# Patient Record
Sex: Male | Born: 1955 | Race: Black or African American | Hispanic: No | Marital: Single | Smoking: Current every day smoker
Health system: Southern US, Community
[De-identification: ages and names within clinical notes are randomized; demographics above are authoritative.]

## PROBLEM LIST (undated history)

## (undated) DIAGNOSIS — I739 Peripheral vascular disease, unspecified: Secondary | ICD-10-CM

## (undated) DIAGNOSIS — F329 Major depressive disorder, single episode, unspecified: Secondary | ICD-10-CM

## (undated) DIAGNOSIS — I10 Essential (primary) hypertension: Secondary | ICD-10-CM

## (undated) DIAGNOSIS — F32A Depression, unspecified: Secondary | ICD-10-CM

## (undated) DIAGNOSIS — E785 Hyperlipidemia, unspecified: Secondary | ICD-10-CM

## (undated) DIAGNOSIS — M1990 Unspecified osteoarthritis, unspecified site: Secondary | ICD-10-CM

## (undated) DIAGNOSIS — K219 Gastro-esophageal reflux disease without esophagitis: Secondary | ICD-10-CM

## (undated) DIAGNOSIS — M797 Fibromyalgia: Secondary | ICD-10-CM

## (undated) HISTORY — DX: Hyperlipidemia, unspecified: E78.5

## (undated) HISTORY — PX: FEMORAL ARTERY STENT: SHX1583

---

## 2006-02-18 ENCOUNTER — Emergency Department (HOSPITAL_COMMUNITY): Admission: EM | Admit: 2006-02-18 | Discharge: 2006-02-19 | Payer: Self-pay | Admitting: Emergency Medicine

## 2011-11-07 ENCOUNTER — Encounter (HOSPITAL_COMMUNITY): Payer: Self-pay | Admitting: Pharmacy Technician

## 2011-11-21 ENCOUNTER — Other Ambulatory Visit: Payer: Self-pay | Admitting: Cardiovascular Disease

## 2011-11-22 ENCOUNTER — Encounter (HOSPITAL_COMMUNITY): Payer: Self-pay | Admitting: General Practice

## 2011-11-22 ENCOUNTER — Encounter (HOSPITAL_COMMUNITY)
Admission: RE | Disposition: A | Discharge status: Home / Self Care | Payer: Self-pay | Source: Ambulatory Visit | Attending: Cardiovascular Disease

## 2011-11-22 ENCOUNTER — Ambulatory Visit (HOSPITAL_COMMUNITY)
Admission: RE | Admit: 2011-11-22 | Discharge: 2011-11-23 | Disposition: A | Discharge status: Home / Self Care | Payer: Medicare Other | Source: Ambulatory Visit | Attending: Cardiovascular Disease | Admitting: Cardiovascular Disease

## 2011-11-22 DIAGNOSIS — E785 Hyperlipidemia, unspecified: Secondary | ICD-10-CM | POA: Diagnosis present

## 2011-11-22 DIAGNOSIS — I10 Essential (primary) hypertension: Secondary | ICD-10-CM | POA: Diagnosis present

## 2011-11-22 DIAGNOSIS — E119 Type 2 diabetes mellitus without complications: Secondary | ICD-10-CM | POA: Diagnosis present

## 2011-11-22 DIAGNOSIS — I70219 Atherosclerosis of native arteries of extremities with intermittent claudication, unspecified extremity: Secondary | ICD-10-CM | POA: Insufficient documentation

## 2011-11-22 DIAGNOSIS — I739 Peripheral vascular disease, unspecified: Secondary | ICD-10-CM | POA: Diagnosis present

## 2011-11-22 DIAGNOSIS — Z720 Tobacco use: Secondary | ICD-10-CM

## 2011-11-22 HISTORY — PX: LOWER EXTREMITY ANGIOGRAM: SHX5508

## 2011-11-22 HISTORY — DX: Essential (primary) hypertension: I10

## 2011-11-22 HISTORY — DX: Unspecified osteoarthritis, unspecified site: M19.90

## 2011-11-22 HISTORY — DX: Major depressive disorder, single episode, unspecified: F32.9

## 2011-11-22 HISTORY — DX: Peripheral vascular disease, unspecified: I73.9

## 2011-11-22 HISTORY — DX: Fibromyalgia: M79.7

## 2011-11-22 HISTORY — DX: Depression, unspecified: F32.A

## 2011-11-22 LAB — BASIC METABOLIC PANEL
Calcium: 9.7 mg/dL (ref 8.4–10.5)
GFR calc non Af Amer: 65 mL/min — ABNORMAL LOW (ref >90)
Sodium: 139 mEq/L (ref 135–145)

## 2011-11-22 LAB — CBC
MCH: 27.9 pg (ref 26.0–34.0)
MCHC: 33.3 g/dL (ref 30.0–36.0)
Platelets: 206 10*3/uL (ref 150–400)
RBC: 5.17 MIL/uL (ref 4.22–5.81)
RDW: 13.7 % (ref 11.5–15.5)

## 2011-11-22 LAB — GLUCOSE, CAPILLARY
Glucose-Capillary: 82 mg/dL (ref 70–99)
Glucose-Capillary: 60 mg/dL — ABNORMAL LOW (ref 70–99)
Glucose-Capillary: 113 mg/dL — ABNORMAL HIGH (ref 70–99)
Glucose-Capillary: 125 mg/dL — ABNORMAL HIGH (ref 70–99)

## 2011-11-22 LAB — PROTIME-INR: Prothrombin Time: 12.6 seconds (ref 11.6–15.2)

## 2011-11-22 SURGERY — ANGIOGRAM, LOWER EXTREMITY
Anesthesia: LOCAL

## 2011-11-22 MED ORDER — FAMOTIDINE IN NACL 20-0.9 MG/50ML-% IV SOLN: 20.0000 mg | INTRAVENOUS | Status: DC

## 2011-11-22 MED ORDER — SIMVASTATIN 40 MG PO TABS
40.0000 mg | ORAL_TABLET | Freq: Every day | ORAL | Status: DC
  Administered 2011-11-22: 40 mg via ORAL
  Filled 2011-11-22 (×2): qty 1

## 2011-11-22 MED ORDER — ASPIRIN 81 MG PO CHEW
CHEWABLE_TABLET | ORAL | Status: AC
  Filled 2011-11-22: qty 4

## 2011-11-22 MED ORDER — FENTANYL CITRATE 0.05 MG/ML IJ SOLN
INTRAMUSCULAR | Status: AC
  Filled 2011-11-22: qty 2

## 2011-11-22 MED ORDER — LIDOCAINE HCL (PF) 1 % IJ SOLN
INTRAMUSCULAR | Status: AC
  Filled 2011-11-22: qty 30

## 2011-11-22 MED ORDER — ACETAMINOPHEN 325 MG PO TABS
650.0000 mg | ORAL_TABLET | ORAL | Status: DC | PRN
  Administered 2011-11-22 (×2): 650 mg via ORAL
  Filled 2011-11-22 (×2): qty 2

## 2011-11-22 MED ORDER — LISINOPRIL-HYDROCHLOROTHIAZIDE 20-25 MG PO TABS: 1.0000 | ORAL_TABLET | Freq: Every day | ORAL | Status: DC

## 2011-11-22 MED ORDER — ALBUTEROL SULFATE HFA 108 (90 BASE) MCG/ACT IN AERS: 2.0000 | INHALATION_SPRAY | Freq: Four times a day (QID) | RESPIRATORY_TRACT | Status: DC | PRN

## 2011-11-22 MED ORDER — DIAZEPAM 5 MG PO TABS
5.0000 mg | ORAL_TABLET | ORAL | Status: AC
  Administered 2011-11-22: 5 mg via ORAL
  Filled 2011-11-22: qty 1

## 2011-11-22 MED ORDER — SODIUM CHLORIDE 0.9 % IV SOLN
INTRAVENOUS | Status: DC
  Administered 2011-11-22: 08:00:00 via INTRAVENOUS

## 2011-11-22 MED ORDER — LISINOPRIL 20 MG PO TABS
20.0000 mg | ORAL_TABLET | Freq: Every day | ORAL | Status: DC
  Administered 2011-11-22 – 2011-11-23 (×2): 20 mg via ORAL
  Filled 2011-11-22 (×2): qty 1

## 2011-11-22 MED ORDER — MIDAZOLAM HCL 2 MG/2ML IJ SOLN
INTRAMUSCULAR | Status: AC
  Filled 2011-11-22: qty 2

## 2011-11-22 MED ORDER — INSULIN GLARGINE 100 UNIT/ML ~~LOC~~ SOLN
65.0000 [IU] | Freq: Every day | SUBCUTANEOUS | Status: DC
  Administered 2011-11-22: 21:00:00 65 [IU] via SUBCUTANEOUS

## 2011-11-22 MED ORDER — DIPHENHYDRAMINE HCL 50 MG/ML IJ SOLN: 25.0000 mg | INTRAMUSCULAR | Status: DC

## 2011-11-22 MED ORDER — SODIUM CHLORIDE 0.9 % IJ SOLN: 3.0000 mL | INTRAMUSCULAR | Status: DC | PRN

## 2011-11-22 MED ORDER — INSULIN ASPART 100 UNIT/ML ~~LOC~~ SOLN
4.0000 [IU] | Freq: Three times a day (TID) | SUBCUTANEOUS | Status: DC
  Administered 2011-11-22 – 2011-11-23 (×3): 4 [IU] via SUBCUTANEOUS

## 2011-11-22 MED ORDER — INSULIN ASPART 100 UNIT/ML ~~LOC~~ SOLN: 4.0000 [IU] | Freq: Three times a day (TID) | SUBCUTANEOUS | Status: DC

## 2011-11-22 MED ORDER — CLOPIDOGREL BISULFATE 75 MG PO TABS
75.0000 mg | ORAL_TABLET | Freq: Every day | ORAL | Status: DC
  Administered 2011-11-23: 75 mg via ORAL
  Filled 2011-11-22: qty 1

## 2011-11-22 MED ORDER — HEPARIN SODIUM (PORCINE) 1000 UNIT/ML IJ SOLN
INTRAMUSCULAR | Status: AC
  Filled 2011-11-22: qty 1

## 2011-11-22 MED ORDER — ASPIRIN EC 325 MG PO TBEC
325.0000 mg | DELAYED_RELEASE_TABLET | Freq: Every day | ORAL | Status: DC
  Administered 2011-11-23: 325 mg via ORAL
  Filled 2011-11-22: qty 1

## 2011-11-22 MED ORDER — SODIUM CHLORIDE 0.9 % IV SOLN
INTRAVENOUS | Status: AC
  Administered 2011-11-22: 14:00:00 via INTRAVENOUS

## 2011-11-22 MED ORDER — METHYLPREDNISOLONE SODIUM SUCC 125 MG IJ SOLR: 60.0000 mg | INTRAMUSCULAR | Status: DC

## 2011-11-22 MED ORDER — ONDANSETRON HCL 4 MG/2ML IJ SOLN: 4.0000 mg | Freq: Four times a day (QID) | INTRAMUSCULAR | Status: DC | PRN

## 2011-11-22 MED ORDER — CLOPIDOGREL BISULFATE 300 MG PO TABS
ORAL_TABLET | ORAL | Status: AC
  Administered 2011-11-23: 09:00:00 75 mg via ORAL
  Filled 2011-11-22: qty 1

## 2011-11-22 MED ORDER — HYDROCHLOROTHIAZIDE 25 MG PO TABS
25.0000 mg | ORAL_TABLET | Freq: Every day | ORAL | Status: DC
  Administered 2011-11-22 – 2011-11-23 (×2): 25 mg via ORAL
  Filled 2011-11-22 (×2): qty 1

## 2011-11-22 MED ORDER — ASPIRIN 81 MG PO TABS: 81.0000 mg | ORAL_TABLET | Freq: Every day | ORAL | Status: DC

## 2011-11-22 NOTE — H&P (Signed)
  H & P will be scanned in.  Pt was reexamined and existing H & P reviewed. No changes found.  Lorretta Harp, MD [facility] 11/22/2011 9:49 AM

## 2011-11-22 NOTE — Op Note (Signed)
Robert Rocha is a 56 y.o. male    SN:[id number] LOCATION:  FACILITY: [facility]  PHYSICIAN: Quay Burow, M.D. Jun 24, 1955   DATE OF PROCEDURE:  11/22/2011  DATE OF DISCHARGE:  [place]  PV Intervention    History obtained from chart review. Mr. 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 He had Dopplers of her office that revealed short segment occlusion of his left SFA. He had a negative Myoview. He presents now for angiography and potential percutaneous intervention.   PROCEDURE DESCRIPTION:    The patient was brought to the second floor  [place] [facility] cath lab in the postabsorptive state. He was  premedicated with Valium 5 mg by mouth, IV Versed and fentanyl.. His ) was prepped and shaved in usual sterile fashion. Xylocaine 1% was used for local anesthesia. A 5 French sheath was inserted into the right common femoral artery using standard Seldinger technique. The patient received 7000 units  of heparin  intravenously.  The ACT was measured at greater than 220. Total of 220 cc of contrast was administered during the case.    HEMODYNAMICS:    AO SYSTOLIC/AO DIASTOLIC: 0000000   ANGIOGRAPHIC RESULTS:   1: Abdominal aorta-renal arteries are widely patent. The infrarenal abdominal aorta and iliac bifurcation were free of significant atherosclerotic changes.  2: Left lower extremity-50% mid left SFA stenosis, short segment occlusion in the adductor canal, three-vessel runoff.  3: Right lower extremity-tandem 50% stenoses in the mid right SFA. The below the knee tibial vessels were adequately visualized.  IMPRESSION:short segment occlusion mid left SFA. We'll proceed with attempt to cross, PTA and stent.   Procedure description: The 5 French sheath was  exchanged for a 7 [place] destination sheath. Contralateral access was obtained with a 5 [place] crossover catheter, angled Glidewire at [employer] wire. I was able to get down to the point of occlusion with an 035 across and told catheter and a stiff angled Glidewire. Was able to cross the lesion, advancing them across over the guidewire and exchanged for a 35 versicolor wire. I then verified with intraluminal projecting through the central catheter. Predilatation was performed with a 4 mm x 4 cm balloon, stenting with a 7 mm x 60 mm Product/process development scientist self expanding stent, and post dilatation was performed with overlapping 6 mm x 4 cm long balloon inflations. Completion angiography was performed revealing reduction with short segment total occlusion to 0% residual with excellent flow vessel runoff.  Final impression: Successful PTA and stenting of short segment of occlusion left SFA with Nitinol self-expanding stent. Patient tolerated the procedure well. The right groin was sealed with the [facility] hemostasis device with excellent result. The patient left the Cath Lab in stable condition. He was given 300 mg of by mouth Plavix prior to leaving the cath lab. He will be hydrated overnight, discharged him in the morning will do followup Dopplers in her office at which he'll see me back.  Lorretta Harp MD, [facility] 11/22/2011 10:53 AM

## 2011-11-22 NOTE — Progress Notes (Signed)
Utilization Review Completed.  

## 2011-11-22 NOTE — Progress Notes (Signed)
Pt denies contrast/shellfish/iodine allergy. Discussed with Jana Half and contrast allergy meds d/c'd.

## 2011-11-23 DIAGNOSIS — I10 Essential (primary) hypertension: Secondary | ICD-10-CM | POA: Diagnosis present

## 2011-11-23 DIAGNOSIS — E785 Hyperlipidemia, unspecified: Secondary | ICD-10-CM | POA: Diagnosis present

## 2011-11-23 DIAGNOSIS — Z720 Tobacco use: Secondary | ICD-10-CM

## 2011-11-23 DIAGNOSIS — I739 Peripheral vascular disease, unspecified: Secondary | ICD-10-CM | POA: Diagnosis present

## 2011-11-23 DIAGNOSIS — E119 Type 2 diabetes mellitus without complications: Secondary | ICD-10-CM | POA: Diagnosis present

## 2011-11-23 LAB — BASIC METABOLIC PANEL
BUN: 13 mg/dL (ref 6–23)
CO2: 30 mEq/L (ref 19–32)
Calcium: 9.8 mg/dL (ref 8.4–10.5)
GFR calc Af Amer: 75 mL/min — ABNORMAL LOW (ref >90)
GFR calc non Af Amer: 65 mL/min — ABNORMAL LOW (ref >90)
Glucose, Bld: 74 mg/dL (ref 70–99)
Sodium: 142 mEq/L (ref 135–145)

## 2011-11-23 LAB — CBC
HCT: 41.4 % (ref 39.0–52.0)
Hemoglobin: 13.6 g/dL (ref 13.0–17.0)
RDW: 13.9 % (ref 11.5–15.5)
WBC: 10.3 10*3/uL (ref 4.0–10.5)

## 2011-11-23 MED ORDER — ASPIRIN 325 MG PO TBEC: 325.0000 mg | DELAYED_RELEASE_TABLET | Freq: Every day | ORAL | Status: DC

## 2011-11-23 MED ORDER — CLOPIDOGREL BISULFATE 75 MG PO TABS: 75.0000 mg | ORAL_TABLET | Freq: Every day | ORAL | Status: DC

## 2011-11-23 MED ORDER — SIMVASTATIN 40 MG PO TABS: 40.0000 mg | ORAL_TABLET | Freq: Every day | ORAL | Status: DC

## 2011-11-23 NOTE — Progress Notes (Signed)
[facility]  Subjective: No Complaints.  Objective: Vital signs in last 24 hours: Temp:  [97.4 F (36.3 C)-98.5 F (36.9 C)] 97.7 F (36.5 C) (10/02 0801) Pulse Rate:  [59-70] 67  (10/02 0801) Resp:  [15-21] 19  (10/02 0801) BP: (112-142)/(67-78) 122/71 mmHg (10/02 0801) SpO2:  [95 %-97 %] 95 % (10/02 0801) Weight:  [102.4 kg (225 lb 12 oz)] 102.4 kg (225 lb 12 oz) (10/02 0020) Last BM Date: 11/22/11  Intake/Output from previous day: 10/01 0701 - 10/02 0700 In: 815 [P.O.:440; I.V.:375] Out: 1600 [Urine:1600] Intake/Output this shift:    Medications Current Facility-Administered Medications  Medication Dose Route Frequency Provider Last Rate Last Dose  . 0.9 %  sodium chloride infusion   Intravenous Continuous Lorretta Harp, MD 75 mL/hr at 11/22/11 1400    . acetaminophen (TYLENOL) tablet 650 mg  650 mg Oral Q4H PRN Lorretta Harp, MD   650 mg at 11/22/11 2115  . albuterol (PROVENTIL HFA;VENTOLIN HFA) 108 (90 BASE) MCG/ACT inhaler 2 puff  2 puff Inhalation Q6H PRN Lorretta Harp, MD      . aspirin 81 MG chewable tablet           . aspirin EC tablet 325 mg  325 mg Oral Daily Lorretta Harp, MD   325 mg at 11/23/11 0843  . clopidogrel (PLAVIX) tablet 75 mg  75 mg Oral Q breakfast Lorretta Harp, MD   75 mg at 11/23/11 0843  . fentaNYL (SUBLIMAZE) 0.05 MG/ML injection           . heparin 1000 UNIT/ML injection           . hydrochlorothiazide (HYDRODIURIL) tablet 25 mg  25 mg Oral Daily Lorretta Harp, MD   25 mg at 11/23/11 0843  . insulin aspart (novoLOG) injection 4 Units  4 Units Subcutaneous TID AC Lorretta Harp, MD   4 Units at 11/23/11 0830  . insulin glargine (LANTUS) injection 65 Units  65 Units Subcutaneous QHS Lorretta Harp, MD   65 Units at 11/22/11 2115  . lidocaine (XYLOCAINE) 1 % injection           . lisinopril (PRINIVIL,ZESTRIL) tablet 20 mg  20 mg Oral Daily Lorretta Harp, MD   20 mg at 11/23/11 0843  .  midazolam (VERSED) 2 MG/2ML injection           . ondansetron (ZOFRAN) injection 4 mg  4 mg Intravenous Q6H PRN Lorretta Harp, MD      . simvastatin (ZOCOR) tablet 40 mg  40 mg Oral q1800 Lorretta Harp, MD   40 mg at 11/22/11 1638  . DISCONTD: 0.9 %  sodium chloride infusion   Intravenous Continuous Lorretta Harp, MD 75 mL/hr at 11/22/11 0756    . DISCONTD: aspirin tablet 81 mg  81 mg Oral Daily Lorretta Harp, MD      . DISCONTD: insulin aspart (novoLOG) injection 4 Units  4 Units Subcutaneous TID AC Lorretta Harp, MD      . DISCONTD: lisinopril-hydrochlorothiazide (PRINZIDE,ZESTORETIC) 20-25 MG per tablet 1 tablet  1 tablet Oral Daily Lorretta Harp, MD      . DISCONTD: sodium chloride 0.9 % injection 3 mL  3 mL Intravenous PRN Lorretta Harp, MD        PE: General appearance: alert, cooperative and no distress Lungs: clear to auscultation bilaterally Heart: regular rate and rhythm, S1, S2 normal, no murmur,  click, rub or gallop Extremities: No LEE Pulses: 2+ and symmetric Skin: Warm and dry.  Mild tenderness at right groin cath site.  No ecchymosis or hematoma. Neurologic: Grossly normal  Lab Results:   Basename 11/23/11 0600 11/22/11 0754  WBC 10.3 11.2*  HGB 13.6 14.4  HCT 41.4 43.3  PLT 205 206   BMET  Basename 11/23/11 0600 11/22/11 0754  NA 142 139  K 3.8 3.7  CL 105 102  CO2 30 27  GLUCOSE 74 86  BUN 13 14  CREATININE 1.21 1.20  CALCIUM 9.8 9.7   PT/INR  Basename 11/22/11 0932  LABPROT 12.6  INR 0.95    Studies/Results: PROCEDURE DESCRIPTION:  The patient was brought to the second floor  [place] Cardiac cath lab in the postabsorptive state. He was  premedicated with Valium 5 mg by mouth, IV Versed and fentanyl.. His )  was prepped and shaved in usual sterile fashion. Xylocaine 1% was used for local anesthesia. A 5 French sheath was inserted into the right common femoral artery using standard Seldinger technique. The patient received  7000 units of heparin intravenously. The ACT was measured at greater than 220. Total of 220 cc of contrast was administered during the case.  HEMODYNAMICS:  AO SYSTOLIC/AO DIASTOLIC: 0000000  ANGIOGRAPHIC RESULTS:  1: Abdominal aorta-renal arteries are widely patent. The infrarenal abdominal aorta and iliac bifurcation were free of significant atherosclerotic changes.  2: Left lower extremity-50% mid left SFA stenosis, short segment occlusion in the adductor canal, three-vessel runoff.  3: Right lower extremity-tandem 50% stenoses in the mid right SFA. The below the knee tibial vessels were adequately visualized.  IMPRESSION:short segment occlusion mid left SFA. We'll proceed with attempt to cross, PTA and stent.  Procedure description: The 5 French sheath was exchanged for a 7 [place] destination sheath. Contralateral access was obtained with a 5 [place] crossover catheter, angled Glidewire at [employer] wire. I was able to get down to the point of occlusion with an 035 across and told catheter and a stiff angled Glidewire. Was able to cross the lesion, advancing them across over the guidewire and exchanged for a 35 versicolor wire. I then verified with intraluminal projecting through the central catheter. Predilatation was performed with a 4 mm x 4 cm balloon, stenting with a 7 mm x 60 mm Product/process development scientist self expanding stent, and post dilatation was performed with overlapping 6 mm x 4 cm long balloon inflations. Completion angiography was performed revealing reduction with short segment total occlusion to 0% residual with excellent flow vessel runoff.  Final impression: Successful PTA and stenting of short segment of occlusion left SFA with Nitinol self-expanding stent. Patient tolerated the procedure well. The right groin was sealed with the [facility] hemostasis device with excellent result. The patient left the Cath Lab in stable condition. He was given 300 mg of by mouth Plavix prior to leaving the  cath lab. He will be hydrated overnight, discharged him in the morning will do followup Dopplers in her office at which he'll see me back.  Lorretta Harp MD, [facility]  11/22/2011  Assessment/Plan  Active Problems:  PAD (peripheral artery disease):  11/22/11:  PTA and stent to left SFA.  HTN (hypertension)  Tobacco abuse  HLD (hyperlipidemia)  DM (diabetes mellitus)  Plan:  S/P  Successful PTA and stenting of short segment of occlusion left SFA with Nitinol self-expanding stent.  Labs, BP and HR stable.  Follow up LEA dopplers and appt with Dr. Gwenlyn Found.  ASA, plavix.  Discussed smoking cessation and nicotine patches.   LOS: 1 day    HAGER, BRYAN 11/23/2011 9:13 AM  I have seen & examined the patient this AM along with Mr. Samara Snide, [place].  I agree with his findings, exam & recommendations.  Mr. Wilks is doing well this AM s/p PTA-stent to 100% LSFA occlusion.  Was able to ambulate last PM without difficulty. The Cath side looks good - mildly tender.  Labs are fine.  He is stable for discharge home today with f/u as noted.  On ASA &Plavix. Smoking cessation ~5 minutes provided by Mr. Samara Snide & myself.  Leonie Man, M.D., M.S. [facility] [address] [place], [place]  [address]  [phone] Pager # [phone]  11/23/2011 9:33 AM

## 2011-11-23 NOTE — Discharge Summary (Signed)
Physician Discharge Summary  Patient ID: Robert Rocha MRN: [id number] DOB/AGE: 57-16-1957 57 y.o.  Admit date: 11/22/2011 Discharge date: 11/23/2011  Admission Diagnoses:  PAD  Discharge Diagnoses:  Active Problems:  PAD (peripheral artery disease):  11/22/11:  PTA and stent to left SFA.  HTN (hypertension)  Tobacco abuse  HLD (hyperlipidemia)  DM (diabetes mellitus)   Discharged Condition: stable  Hospital Course:   Robert Rocha is a 56 year old single African American male father of one,  4 grandchildren, retired long-distance truck driver referred to Dr. Gwenlyn Found by [name] for PVOD , and left lower extremity left lower lifestyle limiting claudication. He had Dopplers of her office that revealed short segment occlusion of his left SFA. He had a negative Myoview. He presented now for angiography and potential percutaneous intervention.  The revealed that the abdominal aorta-renal arteries are widely patent. The infrarenal abdominal aorta and iliac bifurcation were free of significant atherosclerotic changes.  Left lower extremity-50% mid left SFA stenosis, short segment occlusion in the adductor canal, three-vessel runoff.  Right lower extremity-tandem 50% stenoses in the mid right SFA. The below the knee tibial vessels were adequately visualized.  He underwent successful PTA and stenting of short segment of occlusion left SFA with Nitinol self-expanding stent.  He was seen by Dr. Ellyn Hack who felt he was sable for DC home.  He will have outpatient LEA dopplers and appt with Dr. Gwenlyn Found.   Consults: None  Significant Diagnostic Studies:  PROCEDURE DESCRIPTION:  The patient was brought to the second floor  [place] [facility] cath lab in the postabsorptive state. He was  premedicated with Valium 5 mg by mouth, IV Versed and fentanyl.. His )  was prepped and shaved in usual sterile fashion. Xylocaine 1% was used for local anesthesia. A 5 French sheath was inserted into the right common  femoral artery using standard Seldinger technique. The patient received 7000 units of heparin intravenously. The ACT was measured at greater than 220. Total of 220 cc of contrast was administered during the case.  HEMODYNAMICS:  AO SYSTOLIC/AO DIASTOLIC: 0000000  ANGIOGRAPHIC RESULTS:  1: Abdominal aorta-renal arteries are widely patent. The infrarenal abdominal aorta and iliac bifurcation were free of significant atherosclerotic changes.  2: Left lower extremity-50% mid left SFA stenosis, short segment occlusion in the adductor canal, three-vessel runoff.  3: Right lower extremity-tandem 50% stenoses in the mid right SFA. The below the knee tibial vessels were adequately visualized.  IMPRESSION:short segment occlusion mid left SFA. We'll proceed with attempt to cross, PTA and stent.  Procedure description: The 5 French sheath was exchanged for a 7 [place] destination sheath. Contralateral access was obtained with a 5 [place] crossover catheter, angled Glidewire at [employer] wire. I was able to get down to the point of occlusion with an 035 across and told catheter and a stiff angled Glidewire. Was able to cross the lesion, advancing them across over the guidewire and exchanged for a 35 versicolor wire. I then verified with intraluminal projecting through the central catheter. Predilatation was performed with a 4 mm x 4 cm balloon, stenting with a 7 mm x 60 mm Product/process development scientist self expanding stent, and post dilatation was performed with overlapping 6 mm x 4 cm long balloon inflations. Completion angiography was performed revealing reduction with short segment total occlusion to 0% residual with excellent flow vessel runoff.  Final impression: Successful PTA and stenting of short segment of occlusion left SFA with Nitinol self-expanding stent. Patient tolerated the procedure well. The  right groin was sealed with the [facility] hemostasis device with excellent result. The patient left the Cath Lab in stable  condition. He was given 300 mg of by mouth Plavix prior to leaving the cath lab. He will be hydrated overnight, discharged him in the morning will do followup Dopplers in her office at which he'll see me back.  Lorretta Harp MD, [facility]  11/22/2011  Treatments: Stent to Left SFA  Discharge Exam: Blood pressure 122/71, pulse 67, temperature 97.7 F (36.5 C), temperature source Oral, resp. rate 19, height 5\' 11"  (1.803 m), weight 102.4 kg (225 lb 12 oz), SpO2 95.00%.   Disposition: Final discharge disposition not confirmed  Discharge Orders    Future Orders Please Complete By Expires   Diet - low sodium heart healthy      Increase activity slowly      Discharge instructions      Comments:   No lifting more than a half gallon of milk or driving for three days.   Update patient class to inpatient          Medication List     As of 11/23/2011  9:46 AM    STOP taking these medications         aspirin 81 MG tablet      TAKE these medications         albuterol 108 (90 BASE) MCG/ACT inhaler   Commonly known as: PROVENTIL HFA;VENTOLIN HFA   Inhale 2 puffs into the lungs every 6 (six) hours as needed. For shortness of breath      aspirin 325 MG EC tablet   Take 1 tablet (325 mg total) by mouth daily.      clopidogrel 75 MG tablet   Commonly known as: PLAVIX   Take 1 tablet (75 mg total) by mouth daily with breakfast.      ezetimibe 10 MG tablet   Commonly known as: ZETIA   Take 10 mg by mouth daily.      insulin aspart 100 UNIT/ML injection   Commonly known as: novoLOG   Inject 4 Units into the skin 3 (three) times daily before meals.      insulin glargine 100 UNIT/ML injection   Commonly known as: LANTUS   Inject 65 Units into the skin at bedtime.      lisinopril-hydrochlorothiazide 20-25 MG per tablet   Commonly known as: PRINZIDE,ZESTORETIC   Take 1 tablet by mouth daily.      pravastatin 40 MG tablet   Commonly known as: PRAVACHOL   Take 40 mg by mouth daily.             Follow-up Information    Follow up with Lorretta Harp, MD. (Our office will call you with the appt. dates and times.)    Contact information:   [address] [place] [address] [phone]          Signed: Tarri Fuller 11/23/2011, 9:46 AM  I saw & examined the patient this AM along with Mr. Samara Snide, [place]. I agree with his d/c summary.  Mr. Segovia was doing well s/p PTA-stent to 100% LSFA occlusion. Was able to ambulate last PM without difficulty.   He is stable for discharge home today with f/u as noted. On ASA &Plavix.  Smoking cessation ~5 minutes provided by Mr. Samara Snide & myself.  Leonie Man, M.D., M.S. [facility] [address] [place], [place]  [address]  [phone] Pager # [phone]  11/23/2011  10:01 AM

## 2011-11-23 NOTE — Progress Notes (Signed)
Utilization Review Completed.  

## 2012-12-31 ENCOUNTER — Encounter (HOSPITAL_COMMUNITY): Payer: Self-pay | Admitting: *Deleted

## 2012-12-31 ENCOUNTER — Encounter: Payer: Self-pay | Admitting: Cardiovascular Disease

## 2012-12-31 ENCOUNTER — Ambulatory Visit (INDEPENDENT_AMBULATORY_CARE_PROVIDER_SITE_OTHER): Payer: Medicare Other | Admitting: Cardiovascular Disease

## 2012-12-31 VITALS — BP 140/60 | HR 80 | Ht 71.0 in | Wt 239.3 lb

## 2012-12-31 DIAGNOSIS — E785 Hyperlipidemia, unspecified: Secondary | ICD-10-CM

## 2012-12-31 DIAGNOSIS — I739 Peripheral vascular disease, unspecified: Secondary | ICD-10-CM

## 2012-12-31 DIAGNOSIS — I10 Essential (primary) hypertension: Secondary | ICD-10-CM

## 2012-12-31 NOTE — Assessment & Plan Note (Signed)
Controlled on current medications 

## 2012-12-31 NOTE — Progress Notes (Signed)
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 His risk factors include diabetes, hypertension and hyperlipidemia, as well as continued tobacco abuse though he is interested in stopping. He had a negative Myoview in our office November 18, 2011 and Dopplers which initially showed a short-segment occlusion of his left SFA. I angiogrammed him on October 1 confirming this. I recanalized him and stented him with a 7 x 60 mm long Nitinol self-expanding stent. He had 3-vessel runoff. His followup Dopplers revealed an increase in his left ABI to 0.92 with a widely patent stent. His claudication resolved. His lipid profile performed in August of last year  revealed a total cholesterol of 152, LDL 68 and HDL 53.since I saw him a year ago he denies chest pain, shortness of breath, or claudication. He does continue to smoke however.    Current Outpatient Prescriptions  Medication Sig Dispense Refill  . ACCU-CHEK FASTCLIX LANCETS MISC 1 kit by Does not apply route 2 (two) times daily.      Marland Kitchen albuterol (PROVENTIL HFA;VENTOLIN HFA) 108 (90 BASE) MCG/ACT inhaler Inhale 2 puffs into the lungs every 6 (six) hours as needed. For shortness of breath      . aspirin 81 MG tablet Take 81 mg by mouth daily.      . clopidogrel (PLAVIX) 75 MG tablet Take 1 tablet (75 mg total) by mouth daily with breakfast.  30 tablet  11  . glucose blood test strip 1 each by Other route as needed for other. Use as instructed      . insulin aspart (NOVOLOG) 100 UNIT/ML injection Inject 4 Units into the skin 3 (three) times  daily before meals.      . insulin glargine (LANTUS) 100 UNIT/ML injection Inject 65 Units into the skin at bedtime.      Marland Kitchen lisinopril-hydrochlorothiazide (PRINZIDE,ZESTORETIC) 20-25 MG per tablet Take 1 tablet by mouth daily.      Marland Kitchen omeprazole (PRILOSEC) 20 MG capsule Take 20 mg by mouth daily.      Marland Kitchen oxycodone (ROXICODONE) 30 MG immediate release tablet Take 30 mg by mouth 4 (four) times daily.      . rosuvastatin (CRESTOR) 40 MG tablet Take 40 mg by mouth daily.      Marland Kitchen NOVOFINE 32G X 6 MM MISC        No current facility-administered medications for this visit.    No Known Allergies  History   Social History  . Marital Status: Single    Spouse Name: N/A    Number of Children: N/A  . Years of Education: N/A   Occupational History  . Not on file.   Social History Main Topics  . Smoking status: Current Every Day Smoker -- 0.50 packs/day for 40 years    Types: Cigarettes  . Smokeless tobacco: Never Used  . Alcohol Use: No  . Drug Use: No  . Sexual Activity: Not Currently   Other Topics Concern  . Not on file   Social History Narrative  . No narrative on file     Review of Systems: General: negative for chills, fever, night sweats or  weight changes.  Cardiovascular: negative for chest pain, dyspnea on exertion, edema, orthopnea, palpitations, paroxysmal nocturnal dyspnea or shortness of breath Dermatological: negative for rash Respiratory: negative for cough or wheezing Urologic: negative for hematuria Abdominal: negative for nausea, vomiting, diarrhea, bright red blood per rectum, melena, or hematemesis Neurologic: negative for visual changes, syncope, or dizziness All other systems reviewed and are otherwise negative except as noted above.    Blood pressure 140/60, pulse 80, height 5\' 11"  (1.803 m), weight 239 lb 4.8 oz (108.546 kg).  General appearance: alert and no distress Neck: no adenopathy, no carotid bruit, no JVD, supple, symmetrical, trachea midline and  thyroid not enlarged, symmetric, no tenderness/mass/nodules Lungs: clear to auscultation bilaterally Heart: regular rate and rhythm, S1, S2 normal, no murmur, click, rub or gallop Extremities: extremities normal, atraumatic, no cyanosis or edema and diminished pedal pulses  EKG normal sinus rhythm at 80 without ST or T wave changes  ASSESSMENT AND PLAN:   PAD (peripheral artery disease):  11/22/11:  PTA and stent to left SFA. Status post left SFA PTA and stenting of a mid chronic total occlusion using a 7 x 60 mm long stent with an excellent angiographic clinical and Doppler results. He is three-vessel runoff bilaterally. He did have 10 and 50% stenoses in his right SFA. He denies claudication. His last arterial Doppler performed 12/16/11 revealed right ABI 0.0 left ABI of 0.92 with patent stent. Good repeat his lower extremity arterial Doppler study now that the and one year later.  HTN (hypertension) Controlled on current medications  HLD (hyperlipidemia) On statin therapy followed by Robert Cash [name]      Robert Harp MD [facility], [facility] 12/31/2012 3:16 PM

## 2012-12-31 NOTE — Patient Instructions (Signed)
  We will see you back in follow up in 1 year with Dr Berry  Dr Berry has ordered lower extremity arterial dopplers   

## 2012-12-31 NOTE — Assessment & Plan Note (Signed)
On statin therapy followed by [name] [name]

## 2012-12-31 NOTE — Assessment & Plan Note (Signed)
Status post left SFA PTA and stenting of a mid chronic total occlusion using a 7 x 60 mm long stent with an excellent angiographic clinical and Doppler results. He is three-vessel runoff bilaterally. He did have 10 and 50% stenoses in his right SFA. He denies claudication. His last arterial Doppler performed 12/16/11 revealed right ABI 0.0 left ABI of 0.92 with patent stent. Good repeat his lower extremity arterial Doppler study now that the and one year later.

## 2013-01-01 ENCOUNTER — Encounter: Payer: Self-pay | Admitting: Cardiovascular Disease

## 2013-01-08 ENCOUNTER — Ambulatory Visit (HOSPITAL_COMMUNITY)
Admission: RE | Admit: 2013-01-08 | Discharge: 2013-01-08 | Disposition: A | Discharge status: Home / Self Care | Payer: Medicare Other | Source: Ambulatory Visit | Attending: Internal Medicine | Admitting: Internal Medicine

## 2013-01-08 DIAGNOSIS — I739 Peripheral vascular disease, unspecified: Secondary | ICD-10-CM | POA: Insufficient documentation

## 2013-01-08 NOTE — Progress Notes (Signed)
Lower Extremity Arterial Duplex Completed. °Brianna L Mazza,RVT °

## 2013-01-30 ENCOUNTER — Ambulatory Visit (INDEPENDENT_AMBULATORY_CARE_PROVIDER_SITE_OTHER): Payer: Medicare Other | Admitting: Cardiovascular Disease

## 2013-01-30 ENCOUNTER — Encounter: Payer: Self-pay | Admitting: Cardiovascular Disease

## 2013-01-30 VITALS — BP 122/72 | HR 72 | Ht 71.0 in | Wt 240.0 lb

## 2013-01-30 DIAGNOSIS — Z720 Tobacco use: Secondary | ICD-10-CM

## 2013-01-30 DIAGNOSIS — R5383 Other fatigue: Secondary | ICD-10-CM

## 2013-01-30 DIAGNOSIS — Z01818 Encounter for other preprocedural examination: Secondary | ICD-10-CM

## 2013-01-30 DIAGNOSIS — I739 Peripheral vascular disease, unspecified: Secondary | ICD-10-CM

## 2013-01-30 DIAGNOSIS — D689 Coagulation defect, unspecified: Secondary | ICD-10-CM

## 2013-01-30 DIAGNOSIS — F17200 Nicotine dependence, unspecified, uncomplicated: Secondary | ICD-10-CM

## 2013-01-30 DIAGNOSIS — R5381 Other malaise: Secondary | ICD-10-CM

## 2013-01-30 DIAGNOSIS — Z79899 Other long term (current) drug therapy: Secondary | ICD-10-CM

## 2013-01-30 NOTE — Patient Instructions (Signed)
Dr. Gwenlyn Found has ordered a peripheral angiogram to be done at [facility].  This procedure is going to look at the bloodflow in your lower extremities.  If Dr. Gwenlyn Found is able to open up the arteries, you will have to spend one night in the hospital.  If he is not able to open the arteries, you will be able to go home that same day.    After the procedure, you will not be allowed to drive for 3 days or push, pull, or lift anything greater than 10 lbs for one week.    You will be required to have bloodwork and a chest xray prior to your procedure.  Our scheduler will advise you on when these items need to be done.      REPS: Franco Nones ([place]) and [place] Right groin

## 2013-01-30 NOTE — Assessment & Plan Note (Signed)
Robert Rocha had percutaneous intervention on his left SFA a year ago with marked improvement in his Dopplers and his symptoms. He had followup 1 she'll Doppler studies performed 01/08/13 which showed a decrease in his left ABI from 0.9 2.58 with recurrent blockage in that area. We decided to proceed with angiography and intervention.

## 2013-01-30 NOTE — Progress Notes (Signed)
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 His risk factors include diabetes, hypertension and hyperlipidemia, as well as continued tobacco abuse though he is interested in stopping. He had a negative Myoview in our office November 18, 2011 and Dopplers which initially showed a short-segment occlusion of his left SFA. I angiogrammed him on October 1 confirming this. I recanalized him and stented him with a 7 x 60 mm long Nitinol self-expanding stent. He had 3-vessel runoff. His followup Dopplers revealed an increase in his left ABI to 0.92 with a widely patent stent. His claudication resolved. His lipid profile performed in August of last year revealed a total cholesterol of 152, LDL 68 and HDL 53.since I saw him a year ago he denies chest pain, shortness of breath, or claudication. He does continue to smoke however he is brought Chantix simplified to stop smoking. He'll follow Dopplers performed weeks ago that showed PICC line in his left ABI from 0.9 2.58 with recurrent disease of his left SFA. We'll plan on re angiogramming him and performing percutaneous intervention for secondary patency.    Current Outpatient Prescriptions  Medication Sig Dispense Refill  . ACCU-CHEK FASTCLIX LANCETS MISC 1 kit by Does not apply route 2 (two) times daily.      Marland Kitchen albuterol (PROVENTIL HFA;VENTOLIN HFA) 108 (90 BASE) MCG/ACT inhaler Inhale 2 puffs into the lungs every 6 (six) hours as needed. For shortness of breath      . aspirin 81 MG tablet Take 81 mg by mouth daily.      . clopidogrel  (PLAVIX) 75 MG tablet Take 1 tablet (75 mg total) by mouth daily with breakfast.  30 tablet  11  . glucose blood test strip 1 each by Other route as needed for other. Use as instructed      . insulin aspart (NOVOLOG) 100 UNIT/ML injection Inject 4 Units into the skin 3 (three) times daily before meals.      . insulin glargine (LANTUS) 100 UNIT/ML injection Inject 65 Units into the skin at bedtime.      Marland Kitchen lisinopril-hydrochlorothiazide (PRINZIDE,ZESTORETIC) 20-25 MG per tablet Take 1 tablet by mouth daily.      . metFORMIN (GLUCOPHAGE) 500 MG tablet Take 500 mg by mouth daily with breakfast.       . NOVOFINE 32G X 6 MM MISC       . omeprazole (PRILOSEC) 20 MG capsule Take 20 mg by mouth daily.      Marland Kitchen oxycodone (ROXICODONE) 30 MG immediate release tablet Take 30 mg by mouth 4 (four) times daily.      . rosuvastatin (CRESTOR) 40 MG tablet Take 40 mg by mouth daily.       No current facility-administered medications for this visit.    No Known Allergies  History   Social History  . Marital Status: Single    Spouse Name: N/A    Number of Children: N/A  . Years of Education: N/A   Occupational History  . Not on file.   Social History Main Topics  . Smoking status: Current Every Day Smoker -- 0.50 packs/day for 40  years    Types: Cigarettes  . Smokeless tobacco: Never Used  . Alcohol Use: No  . Drug Use: No  . Sexual Activity: Not Currently   Other Topics Concern  . Not on file   Social History Narrative  . No narrative on file     Review of Systems: General: negative for chills, fever, night sweats or weight changes.  Cardiovascular: negative for chest pain, dyspnea on exertion, edema, orthopnea, palpitations, paroxysmal nocturnal dyspnea or shortness of breath Dermatological: negative for rash Respiratory: negative for cough or wheezing Urologic: negative for hematuria Abdominal: negative for nausea, vomiting, diarrhea, bright red blood per rectum, melena, or  hematemesis Neurologic: negative for visual changes, syncope, or dizziness All other systems reviewed and are otherwise negative except as noted above.    Blood pressure 122/72, pulse 72, height 5\' 11"  (1.803 m), weight 240 lb (108.863 kg).  General appearance: alert and no distress Neck: no adenopathy, no carotid bruit, no JVD, supple, symmetrical, trachea midline and thyroid not enlarged, symmetric, no tenderness/mass/nodules Lungs: clear to auscultation bilaterally Heart: regular rate and rhythm, S1, S2 normal, no murmur, click, rub or gallop Extremities: extremities normal, atraumatic, no cyanosis or edema  EKG not performed today  ASSESSMENT AND PLAN:   PAD (peripheral artery disease):  11/22/11:  PTA and stent to left SFA. Robert Rocha had percutaneous intervention on his left SFA a year ago with marked improvement in his Dopplers and his symptoms. He had followup 1 she'll Doppler studies performed 01/08/13 which showed a decrease in his left ABI from 0.9 2.58 with recurrent blockage in that area. We decided to proceed with angiography and intervention.      Robert Harp MD FACP,FACC,FAHA, [facility] 01/30/2013 4:10 PM

## 2013-01-31 ENCOUNTER — Encounter: Payer: Self-pay | Admitting: Cardiovascular Disease

## 2013-02-19 ENCOUNTER — Encounter (HOSPITAL_COMMUNITY): Payer: Self-pay | Admitting: Pharmacy Technician

## 2013-02-25 ENCOUNTER — Ambulatory Visit
Admission: RE | Admit: 2013-02-25 | Discharge: 2013-02-25 | Disposition: A | Discharge status: Home / Self Care | Payer: Medicare Other | Source: Ambulatory Visit | Attending: Cardiovascular Disease | Admitting: Cardiovascular Disease

## 2013-02-25 DIAGNOSIS — Z720 Tobacco use: Secondary | ICD-10-CM

## 2013-02-25 LAB — CBC
HCT: 42.4 % (ref 39.0–52.0)
Hemoglobin: 13.8 g/dL (ref 13.0–17.0)
MCH: 27.1 pg (ref 26.0–34.0)
MCHC: 32.5 g/dL (ref 30.0–36.0)
MCV: 83.3 fL (ref 78.0–100.0)
Platelets: 242 10*3/uL (ref 150–400)
RBC: 5.09 MIL/uL (ref 4.22–5.81)
RDW: 14.6 % (ref 11.5–15.5)
WBC: 12.3 10*3/uL — ABNORMAL HIGH (ref 4.0–10.5)

## 2013-02-25 LAB — BASIC METABOLIC PANEL
BUN: 12 mg/dL (ref 6–23)
CO2: 32 mEq/L (ref 19–32)
Calcium: 10 mg/dL (ref 8.4–10.5)
Chloride: 100 mEq/L (ref 96–112)
Creat: 1.39 mg/dL — ABNORMAL HIGH (ref 0.50–1.35)
Glucose, Bld: 169 mg/dL — ABNORMAL HIGH (ref 70–99)
Potassium: 4.5 mEq/L (ref 3.5–5.3)
Sodium: 139 mEq/L (ref 135–145)

## 2013-02-25 LAB — TSH: TSH: 3.391 u[IU]/mL (ref 0.350–4.500)

## 2013-02-25 LAB — PROTIME-INR
INR: 0.92 (ref <1.50)
Prothrombin Time: 12.3 seconds (ref 11.6–15.2)

## 2013-02-25 LAB — APTT: aPTT: 29 seconds (ref 24–37)

## 2013-02-28 ENCOUNTER — Encounter (HOSPITAL_COMMUNITY): Payer: Self-pay | Admitting: General Practice

## 2013-02-28 ENCOUNTER — Ambulatory Visit (HOSPITAL_COMMUNITY)
Admission: RE | Admit: 2013-02-28 | Discharge: 2013-03-01 | Disposition: A | Discharge status: Home / Self Care | Payer: Medicare Other | Source: Ambulatory Visit | Attending: Cardiovascular Disease | Admitting: Cardiovascular Disease

## 2013-02-28 ENCOUNTER — Encounter (HOSPITAL_COMMUNITY)
Admission: RE | Disposition: A | Discharge status: Home / Self Care | Payer: Medicare Other | Source: Ambulatory Visit | Attending: Cardiovascular Disease

## 2013-02-28 ENCOUNTER — Other Ambulatory Visit: Payer: Self-pay | Admitting: Cardiology

## 2013-02-28 ENCOUNTER — Encounter: Payer: Self-pay | Admitting: *Deleted

## 2013-02-28 DIAGNOSIS — I70219 Atherosclerosis of native arteries of extremities with intermittent claudication, unspecified extremity: Secondary | ICD-10-CM | POA: Insufficient documentation

## 2013-02-28 DIAGNOSIS — I10 Essential (primary) hypertension: Secondary | ICD-10-CM | POA: Insufficient documentation

## 2013-02-28 DIAGNOSIS — F17200 Nicotine dependence, unspecified, uncomplicated: Secondary | ICD-10-CM | POA: Insufficient documentation

## 2013-02-28 DIAGNOSIS — Z7982 Long term (current) use of aspirin: Secondary | ICD-10-CM | POA: Diagnosis not present

## 2013-02-28 DIAGNOSIS — E119 Type 2 diabetes mellitus without complications: Secondary | ICD-10-CM | POA: Diagnosis not present

## 2013-02-28 DIAGNOSIS — Z01818 Encounter for other preprocedural examination: Secondary | ICD-10-CM

## 2013-02-28 DIAGNOSIS — I739 Peripheral vascular disease, unspecified: Secondary | ICD-10-CM | POA: Diagnosis present

## 2013-02-28 DIAGNOSIS — E785 Hyperlipidemia, unspecified: Secondary | ICD-10-CM | POA: Insufficient documentation

## 2013-02-28 DIAGNOSIS — T82898A Other specified complication of vascular prosthetic devices, implants and grafts, initial encounter: Secondary | ICD-10-CM | POA: Diagnosis not present

## 2013-02-28 DIAGNOSIS — Z794 Long term (current) use of insulin: Secondary | ICD-10-CM | POA: Insufficient documentation

## 2013-02-28 DIAGNOSIS — Z720 Tobacco use: Secondary | ICD-10-CM

## 2013-02-28 DIAGNOSIS — Y831 Surgical operation with implant of artificial internal device as the cause of abnormal reaction of the patient, or of later complication, without mention of misadventure at the time of the procedure: Secondary | ICD-10-CM | POA: Diagnosis not present

## 2013-02-28 HISTORY — PX: ATHERECTOMY: SHX47

## 2013-02-28 HISTORY — PX: LOWER EXTREMITY ANGIOGRAM: SHX5508

## 2013-02-28 HISTORY — DX: Gastro-esophageal reflux disease without esophagitis: K21.9

## 2013-02-28 LAB — GLUCOSE, CAPILLARY
GLUCOSE-CAPILLARY: 110 mg/dL — AB (ref 70–99)
GLUCOSE-CAPILLARY: 67 mg/dL — AB (ref 70–99)
GLUCOSE-CAPILLARY: 202 mg/dL — AB (ref 70–99)
Glucose-Capillary: 60 mg/dL — ABNORMAL LOW (ref 70–99)
Glucose-Capillary: 127 mg/dL — ABNORMAL HIGH (ref 70–99)
Glucose-Capillary: 193 mg/dL — ABNORMAL HIGH (ref 70–99)

## 2013-02-28 LAB — POCT ACTIVATED CLOTTING TIME
ACTIVATED CLOTTING TIME: 210 s
Activated Clotting Time: 238 seconds
Activated Clotting Time: 177 seconds

## 2013-02-28 SURGERY — ANGIOGRAM, LOWER EXTREMITY
Anesthesia: LOCAL

## 2013-02-28 MED ORDER — ACETAMINOPHEN 325 MG PO TABS: 650.0000 mg | ORAL_TABLET | ORAL | Status: DC | PRN

## 2013-02-28 MED ORDER — HEPARIN SODIUM (PORCINE) 1000 UNIT/ML IJ SOLN
INTRAMUSCULAR | Status: AC
  Filled 2013-02-28: qty 1

## 2013-02-28 MED ORDER — CLOPIDOGREL BISULFATE 75 MG PO TABS
300.0000 mg | ORAL_TABLET | Freq: Once | ORAL | Status: AC
  Administered 2013-02-28: 300 mg via ORAL

## 2013-02-28 MED ORDER — MIDAZOLAM HCL 2 MG/2ML IJ SOLN
INTRAMUSCULAR | Status: AC
  Filled 2013-02-28: qty 2

## 2013-02-28 MED ORDER — ONDANSETRON HCL 4 MG/2ML IJ SOLN: 4.0000 mg | Freq: Four times a day (QID) | INTRAMUSCULAR | Status: DC | PRN

## 2013-02-28 MED ORDER — LISINOPRIL 20 MG PO TABS
20.0000 mg | ORAL_TABLET | Freq: Every day | ORAL | Status: DC
  Administered 2013-02-28: 20 mg via ORAL
  Filled 2013-02-28 (×3): qty 1

## 2013-02-28 MED ORDER — SODIUM CHLORIDE 0.9 % IV SOLN
INTRAVENOUS | Status: DC
Start: 2013-03-01 — End: 2013-02-28
  Administered 2013-02-28: 08:00:00 via INTRAVENOUS

## 2013-02-28 MED ORDER — ALBUTEROL SULFATE (2.5 MG/3ML) 0.083% IN NEBU: 2.5000 mg | INHALATION_SOLUTION | Freq: Four times a day (QID) | RESPIRATORY_TRACT | Status: DC | PRN

## 2013-02-28 MED ORDER — ZOLPIDEM TARTRATE 5 MG PO TABS: 10.0000 mg | ORAL_TABLET | Freq: Every evening | ORAL | Status: DC | PRN

## 2013-02-28 MED ORDER — ASPIRIN 81 MG PO CHEW
81.0000 mg | CHEWABLE_TABLET | Freq: Every day | ORAL | Status: DC
  Filled 2013-02-28: qty 1

## 2013-02-28 MED ORDER — SODIUM CHLORIDE 0.9 % IJ SOLN: 3.0000 mL | INTRAMUSCULAR | Status: DC | PRN

## 2013-02-28 MED ORDER — ALPRAZOLAM 0.25 MG PO TABS: 0.2500 mg | ORAL_TABLET | Freq: Three times a day (TID) | ORAL | Status: DC | PRN

## 2013-02-28 MED ORDER — PANTOPRAZOLE SODIUM 40 MG PO TBEC
40.0000 mg | DELAYED_RELEASE_TABLET | Freq: Every day | ORAL | Status: DC
  Administered 2013-02-28: 40 mg via ORAL
  Filled 2013-02-28: qty 1

## 2013-02-28 MED ORDER — HYDROCHLOROTHIAZIDE 25 MG PO TABS
25.0000 mg | ORAL_TABLET | Freq: Every day | ORAL | Status: DC
  Administered 2013-02-28: 25 mg via ORAL
  Filled 2013-02-28 (×4): qty 1

## 2013-02-28 MED ORDER — MORPHINE SULFATE 2 MG/ML IJ SOLN: 2.0000 mg | INTRAMUSCULAR | Status: DC | PRN

## 2013-02-28 MED ORDER — LISINOPRIL-HYDROCHLOROTHIAZIDE 20-25 MG PO TABS: 1.0000 | ORAL_TABLET | Freq: Every day | ORAL | Status: DC

## 2013-02-28 MED ORDER — CLOPIDOGREL BISULFATE 300 MG PO TABS
ORAL_TABLET | ORAL | Status: AC
  Filled 2013-02-28: qty 1

## 2013-02-28 MED ORDER — HYDRALAZINE HCL 20 MG/ML IJ SOLN: 10.0000 mg | INTRAMUSCULAR | Status: DC

## 2013-02-28 MED ORDER — EZETIMIBE 10 MG PO TABS
10.0000 mg | ORAL_TABLET | Freq: Every day | ORAL | Status: DC
Start: 2013-02-28 — End: 2013-03-01
  Administered 2013-02-28: 10 mg via ORAL
  Filled 2013-02-28 (×2): qty 1

## 2013-02-28 MED ORDER — ASPIRIN 81 MG PO CHEW
81.0000 mg | CHEWABLE_TABLET | ORAL | Status: AC
  Administered 2013-02-28: 81 mg via ORAL
  Filled 2013-02-28: qty 1

## 2013-02-28 MED ORDER — ALBUTEROL SULFATE HFA 108 (90 BASE) MCG/ACT IN AERS: 2.0000 | INHALATION_SPRAY | Freq: Four times a day (QID) | RESPIRATORY_TRACT | Status: DC | PRN

## 2013-02-28 MED ORDER — SODIUM CHLORIDE 0.9 % IV SOLN
INTRAVENOUS | Status: AC
  Administered 2013-02-28: 12:00:00 via INTRAVENOUS

## 2013-02-28 MED ORDER — LIDOCAINE HCL (PF) 1 % IJ SOLN
INTRAMUSCULAR | Status: AC
  Filled 2013-02-28: qty 30

## 2013-02-28 MED ORDER — ZOLPIDEM TARTRATE 5 MG PO TABS: 5.0000 mg | ORAL_TABLET | Freq: Every evening | ORAL | Status: DC | PRN

## 2013-02-28 MED ORDER — DIAZEPAM 5 MG PO TABS
5.0000 mg | ORAL_TABLET | ORAL | Status: AC
  Administered 2013-02-28: 5 mg via ORAL
  Filled 2013-02-28: qty 1

## 2013-02-28 MED ORDER — FENTANYL CITRATE 0.05 MG/ML IJ SOLN
INTRAMUSCULAR | Status: AC
  Filled 2013-02-28: qty 2

## 2013-02-28 MED ORDER — INSULIN PEN NEEDLE 32G X 6 MM MISC: Status: DC

## 2013-02-28 MED ORDER — HEPARIN (PORCINE) IN NACL 2-0.9 UNIT/ML-% IJ SOLN
INTRAMUSCULAR | Status: AC
  Filled 2013-02-28: qty 500

## 2013-02-28 MED ORDER — OXYCODONE HCL 5 MG PO TABS
30.0000 mg | ORAL_TABLET | Freq: Four times a day (QID) | ORAL | Status: DC
  Administered 2013-02-28: 30 mg via ORAL
  Filled 2013-02-28 (×2): qty 6

## 2013-02-28 MED ORDER — INSULIN ASPART 100 UNIT/ML ~~LOC~~ SOLN
4.0000 [IU] | Freq: Three times a day (TID) | SUBCUTANEOUS | Status: DC
Start: 2013-02-28 — End: 2013-03-01
  Administered 2013-02-28 – 2013-03-01 (×2): 4 [IU] via SUBCUTANEOUS

## 2013-02-28 MED ORDER — INSULIN GLARGINE 100 UNIT/ML ~~LOC~~ SOLN
65.0000 [IU] | Freq: Every day | SUBCUTANEOUS | Status: DC
  Administered 2013-02-28: 65 [IU] via SUBCUTANEOUS
  Filled 2013-02-28 (×2): qty 0.65

## 2013-02-28 NOTE — Progress Notes (Signed)
Discussed diet, activity, groin precautions, and smoking cessation.  Pt has IDDM but drinks regular sodas and regular sugar in his coffee.  Discussed risk for blindness, amputation, renal failure due to chronically elevated blood sugar.  Pt states "it doesn't affect me much, it goes up but goes right back down."  States blood sugar at home is usually around 150's.  Living Well with Diabetes book ordered for patient, denied desire to watch Diabetes education videos.  Pt states has cut back cigarette use to 3/day and thinks he "is done with smoking."  Written tips for success given and reviewed w/ pt, support hotline number given.  Pt verifed he takes 30 mg of "Roxy" 4x daily at home for chronic back pain.  Pt states "it works, it knocks me out."  Pt denies pain at this time, last dose of Oxy IR 30 mg was at 1430.  Encouraged pt to discuss with MD managing his pain to change medication to something that may improve quality of life by allowing him to stay awake more during the day.  Reinforced importance of not getting up w/o assist due to risk of falls and bleeding, pt voiced understanding.  Rt groin level 0.

## 2013-02-28 NOTE — CV Procedure (Signed)
Rod Venturino is a 58 y.o. male    SN:[id number] LOCATION:  FACILITY: [facility]  PHYSICIAN: Quay Burow, M.D. 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 His risk factors include diabetes, hypertension and hyperlipidemia, as well as continued tobacco abuse though he is interested in stopping. He had a negative Myoview in our office November 18, 2011 and Dopplers which initially showed a short-segment occlusion of his left SFA. I angiogrammed him on October 1 confirming this. I recanalized him and stented him with a 7 x 60 mm long Nitinol self-expanding stent. He had 3-vessel runoff. His followup Dopplers revealed an increase in his left ABI to 0.92 with a widely patent stent. His claudication resolved. His lipid profile performed in August of last year revealed a total cholesterol of 152, LDL 68 and HDL 53.since I saw him a year ago he denies chest pain, shortness of breath, or claudication. He does continue to smoke however he is brought Chantix simplified to stop smoking. He'll follow Dopplers performed weeks ago that showed PICC line in his left ABI from 0.9 2.58 with recurrent disease of his left SFA. We'll plan on re angiogramming him and performing percutaneous intervention for secondary patency.    PROCEDURE DESCRIPTION:   The patient was brought to the second floor [facility] cath lab in the postabsorptive state. He was premedicated with Valium 5 mg by mouth, IV Versed and fentanyl. His right groin was prepped and shaved in usual sterile fashion. Xylocaine 1% was used for local anesthesia. A 5 French  sheath was inserted into the right common femoral artery using standard Seldinger technique.a 5 French pigtail catheter was used for abdominal aortography, bilateral iliac angiography and bifemoral runoff. Visipaque dye was used for the entirety of the case. Retrograde aortic, left ventricular and pullback pressures were recorded.  HEMODYNAMICS:    AO SYSTOLIC/AO DIASTOLIC: 0000000   Angiographic Data:   1: Abdominal aortogram-the distal abdominal aorta was free of significant atherosclerotic changes  2: Right lower extremity-there was a 50% segmental mid right SFA stenosis with three-vessel runoff  3: Left lower extremity-there was a 90% in-stent restenosis  within the previously placed mid left SFA stent with three-vessel runoff  IMPRESSION:high-grade in-stent restenosis within the previously placed left SFA Nitinol self-expanding stent. We'll proceed with directional atherectomy using turbohawk  Procedure Description:contralateral access was obtained with a crossover catheter, a Glidewire, and 035 Rosen wire. Following this a 7 [place] the centimeter long destination sheath was then advanced over the bifurcation and placed in the left common femoral artery. The patient received a total of 144 cc of contrast. He received 6000 units of heparin with an ACT of 210. The lesion was crossed with an 014/300 cm long length Sparticore wire. Following this directional atherectomy was performed with an LX S turbo hawk atherectomy device. Multiple cuts were performed around the provider of the stent at removing a copious amount of whitish atherosclerotic material. The final angiographic result with reduction of a 90% in-stent restenosis to less than 20% residual with excellent distal flow and no dissection or perforation.  Final Impression: successful turbo hawk directional atherectomy of left SFA  in-stent restenosis for lifestyle limiting claudication. The patient tolerated the procedure suture well. He'll be  treated with aspirin Plavix, hydrated and discharged home in the morning. The sheath will be removed once the ACT falls below 170 and pressure will be held on the groin. He will get followup arterial Doppler studies in my office and will see me back after that.    Lorretta Harp MD, [facility] 02/28/2013 11:23 AM

## 2013-03-01 ENCOUNTER — Other Ambulatory Visit: Payer: Self-pay | Admitting: Cardiology

## 2013-03-01 DIAGNOSIS — T82898A Other specified complication of vascular prosthetic devices, implants and grafts, initial encounter: Secondary | ICD-10-CM | POA: Diagnosis not present

## 2013-03-01 DIAGNOSIS — E785 Hyperlipidemia, unspecified: Secondary | ICD-10-CM

## 2013-03-01 DIAGNOSIS — I739 Peripheral vascular disease, unspecified: Secondary | ICD-10-CM

## 2013-03-01 DIAGNOSIS — I10 Essential (primary) hypertension: Secondary | ICD-10-CM

## 2013-03-01 DIAGNOSIS — F17200 Nicotine dependence, unspecified, uncomplicated: Secondary | ICD-10-CM

## 2013-03-01 LAB — CBC
HCT: 38 % — ABNORMAL LOW (ref 39.0–52.0)
HEMOGLOBIN: 12.4 g/dL — AB (ref 13.0–17.0)
MCH: 27.7 pg (ref 26.0–34.0)
MCHC: 32.6 g/dL (ref 30.0–36.0)
MCV: 85 fL (ref 78.0–100.0)
PLATELETS: 180 10*3/uL (ref 150–400)
RBC: 4.47 MIL/uL (ref 4.22–5.81)
RDW: 14.3 % (ref 11.5–15.5)
WBC: 8.6 10*3/uL (ref 4.0–10.5)

## 2013-03-01 LAB — BASIC METABOLIC PANEL
BUN: 10 mg/dL (ref 6–23)
CALCIUM: 9.1 mg/dL (ref 8.4–10.5)
CO2: 29 mEq/L (ref 19–32)
CREATININE: 1.29 mg/dL (ref 0.50–1.35)
Chloride: 100 mEq/L (ref 96–112)
GFR calc Af Amer: 69 mL/min — ABNORMAL LOW (ref >90)
GFR, EST NON AFRICAN AMERICAN: 60 mL/min — AB (ref >90)
GLUCOSE: 174 mg/dL — AB (ref 70–99)
Potassium: 4.3 mEq/L (ref 3.7–5.3)
SODIUM: 139 meq/L (ref 137–147)

## 2013-03-01 LAB — GLUCOSE, CAPILLARY: GLUCOSE-CAPILLARY: 123 mg/dL — AB (ref 70–99)

## 2013-03-01 MED ORDER — CLOPIDOGREL BISULFATE 75 MG PO TABS
75.0000 mg | ORAL_TABLET | Freq: Every day | ORAL | Status: DC
  Administered 2013-03-01: 09:00:00 75 mg via ORAL
  Filled 2013-03-01: qty 1

## 2013-03-01 MED ORDER — CLOPIDOGREL BISULFATE 75 MG PO TABS: 75.0000 mg | ORAL_TABLET | Freq: Every day | ORAL | Status: DC

## 2013-03-01 MED ORDER — LIVING WELL WITH DIABETES BOOK
Freq: Once | Status: DC
  Filled 2013-03-01: qty 1

## 2013-03-01 NOTE — Progress Notes (Signed)
Subjective: No complaints. Denies groin, back or flank pain. Ambulated the halls last PM w/o difficulty.  Objective: Vital signs in last 24 hours: Temp:  [97.9 F (36.6 C)-98.5 F (36.9 C)] 98.4 F (36.9 C) (01/09 0549) Pulse Rate:  [57-73] 66 (01/09 0549) Resp:  [18-20] 18 (01/09 0549) BP: (102-149)/(49-78) 103/68 mmHg (01/09 0549) SpO2:  [95 %-100 %] 100 % (01/09 0549) Weight:  [237 lb (107.502 kg)-246 lb 0.5 oz (111.6 kg)] 246 lb 0.5 oz (111.6 kg) (01/09 0037) Last BM Date: 02/27/13  Intake/Output from previous day: 01/08 0701 - 01/09 0700 In: 1160 [P.O.:440; I.V.:720] Out: 750 [Urine:750] Intake/Output this shift: Total I/O In: 920 [P.O.:200; I.V.:720] Out: 400 [Urine:400]  Medications Current Facility-Administered Medications  Medication Dose Route Frequency Provider Last Rate Last Dose  . acetaminophen (TYLENOL) tablet 650 mg  650 mg Oral Q4H PRN Lorretta Harp, MD      . albuterol (PROVENTIL) (2.5 MG/3ML) 0.083% nebulizer solution 2.5 mg  2.5 mg Nebulization Q6H PRN Lorretta Harp, MD      . ALPRAZolam Duanne Moron) tablet 0.25 mg  0.25 mg Oral TID PRN Erlene Quan, [name]      . aspirin chewable tablet 81 mg  81 mg Oral Daily Lorretta Harp, MD      . ezetimibe (ZETIA) tablet 10 mg  10 mg Oral Daily Lorretta Harp, MD   10 mg at 02/28/13 1850  . hydrALAZINE (APRESOLINE) injection 10 mg  10 mg Intravenous UD Lorretta Harp, MD      . lisinopril (PRINIVIL,ZESTRIL) tablet 20 mg  20 mg Oral Daily Lorretta Harp, MD   20 mg at 02/28/13 2104   And  . hydrochlorothiazide (HYDRODIURIL) tablet 25 mg  25 mg Oral Daily Lorretta Harp, MD   25 mg at 02/28/13 2104  . insulin aspart (novoLOG) injection 4 Units  4 Units Subcutaneous TID AC Lorretta Harp, MD   4 Units at 02/28/13 1830  . insulin glargine (LANTUS) injection 65 Units  65 Units Subcutaneous QHS Lorretta Harp, MD   65 Units at 02/28/13 2256  . living well with diabetes book MISC   Does not apply Once  Lorretta Harp, MD      . morphine 2 MG/ML injection 2 mg  2 mg Intravenous Q1H PRN Lorretta Harp, MD      . ondansetron [facility]) injection 4 mg  4 mg Intravenous Q6H PRN Lorretta Harp, MD      . oxyCODONE (Oxy IR/ROXICODONE) immediate release tablet 30 mg  30 mg Oral QID Lorretta Harp, MD   30 mg at 02/28/13 1431  . pantoprazole (PROTONIX) EC tablet 40 mg  40 mg Oral Daily Lorretta Harp, MD   40 mg at 02/28/13 1852  . zolpidem (AMBIEN) tablet 5 mg  5 mg Oral QHS PRN Jaquita Folds, RPH        PE: General appearance: alert, cooperative and no distress Lungs: clear to auscultation bilaterally Heart: regular rate and rhythm, S1, S2 normal, no murmur, click, rub or gallop Extremities: no LEE Pulses: 2+ and symmetric Skin: warm and dry Neurologic: Grossly normal  Lab Results:   Recent Labs  03/01/13 0245  WBC 8.6  HGB 12.4*  HCT 38.0*  PLT 180   BMET  Recent Labs  03/01/13 0245  NA 139  K 4.3  CL 100  CO2 29  GLUCOSE 174*  BUN 10  CREATININE 1.29  CALCIUM 9.1  Studies/Results:  PV Angio 02/28/13 HEMODYNAMICS:  AO SYSTOLIC/AO DIASTOLIC: 0000000  Angiographic Data:  1: Abdominal aortogram-the distal abdominal aorta was free of significant atherosclerotic changes  2: Right lower extremity-there was a 50% segmental mid right SFA stenosis with three-vessel runoff  3: Left lower extremity-there was a 90% in-stent restenosis within the previously placed mid left SFA stent with three-vessel runoff  IMPRESSION:high-grade in-stent restenosis within the previously placed left SFA Nitinol self-expanding stent. We'll proceed with directional atherectomy using turbohawk    Assessment/Plan  Active Problems:   PAD (peripheral artery disease):  11/22/11:  PTA and stent to left SFA.   HTN (hypertension)   HLD (hyperlipidemia)   DM (diabetes mellitus)   Claudication  Plan: Day 1 s/p turbo hawk directional atherectomy of left SFA for in-stent restenosis, leading to  lifestyle limiting claudication. No leg, groin, back or flank pain. Groin stable. Palpable distal pulses. Ambulating w/o difficulty. HR and BP stable. Labs normal. Plan for d/c home today on ASA + Plavix. Will order f/u office dopplers. F/u with Dr. Gwenlyn Found in clinic.     LOS: 1 day    Brittainy M. Ladoris Gene 03/01/2013 6:42 AM  I have examined the patient and reviewed assessment and plan and discussed with patient.  Agree with above as stated. 3+ right DP pulse; 2+ left DP pulse; no right groin hematoma.  OK for d/c on aspirin and plavix.  F/u with Dr. Francee Gentile.

## 2013-03-01 NOTE — Discharge Summary (Addendum)
Physician Discharge Summary  Patient ID: Robert Rocha MRN: [id number] DOB/AGE: 1955-12-05 58 y.o.  Admit date: 02/28/2013 Discharge date: 03/01/2013  Admission Diagnoses: PAD w/ Claudication  Discharge Diagnoses:  Active Problems:   PAD (peripheral artery disease): 11/22/11: PTA and stent to left SFA. 02/28/13: rotational attherectomy for ISR of SFA stent   HTN (hypertension)   HLD (hyperlipidemia)   DM (diabetes mellitus)   Claudication   Discharged Condition: good  Hospital Course: The patient is a 58 year old African American male who was initially referred to Dr. Gwenlyn Found, by Dr. Lennice Sites for evaluation of peripheral vascular evaluation because of lifestyle limiting claudication. His risk factors include diabetes, hypertension and hyperlipidemia, as well as continued tobacco abuse. He had a negative Myoview in our office November 18, 2011 and Dopplers which initially showed a short-segment occlusion of his left SFA. Dr. Gwenlyn Found angiogrammed him on November 22, 2011 confirming this and he underwent stenting with a 7 x 60 mm long Nitinol self-expanding stent. He had 3-vessel runoff. His followup Dopplers revealed an increase in his left ABI to 0.92 with a widely patent stent. His claudication resolved initially, however his claudication recurred. He had repeat dopplers that showed a decline in his left ABI from 0.92 to 0.58 with recurrent disease of his left SFA. He presented back to [facility] on 02/28/13 to undergo a PV angiogram. The procedure was performed by Dr. Gwenlyn Found. He was found to have high grade in-stent restenosis within the previously placed left SFA stent. This was successfully treated with turbo hawk directional atherectomy. The final angiographic result showed a reduction of 90% in-stent restenosis to less than 20% residual with excellent distal flow and no dissection or perforation. He tolerated the procedure well. He was kept overnight for observation and hydration. He had no post-cath  complications. The femoral access site remained stable, free from hematoma and bruit. He had no pain with ambulation. His renal function remained stable. He was started on DAPT with ASA + Plavix. He was last seen and examined by Dr. Irish Lack, who determined he was stable for discharge home. He is ordred to have follow-up doppler studies prior to follow-up with Dr. Gwenlyn Found.   Consults: None  Significant Diagnostic Studies:   PV Angiogram 02/28/17 HEMODYNAMICS:  AO SYSTOLIC/AO DIASTOLIC: 619/50  Angiographic Data:  1: Abdominal aortogram-the distal abdominal aorta was free of significant atherosclerotic changes  2: Right lower extremity-there was a 50% segmental mid right SFA stenosis with three-vessel runoff  3: Left lower extremity-there was a 90% in-stent restenosis within the previously placed mid left SFA stent with three-vessel runoff  IMPRESSION:high-grade in-stent restenosis within the previously placed left SFA Nitinol self-expanding stent. We'll proceed with directional atherectomy using turbohawk    Treatments: See Hospital Course  Discharge Exam: Blood pressure 114/60, pulse 72, temperature 98.8 F (37.1 C), temperature source Oral, resp. rate 18, height _0  (1.803 m), weight 246 lb 0.5 oz (111.6 kg), SpO2 100.00%.   Disposition: 01-Home or Self Care      Discharge Orders   Future Appointments Provider Department Dept Phone   04/02/2013 4:00 PM Lorretta Harp, MD [facility] [phone]   Future Orders Complete By Expires   Diet - low sodium heart healthy  As directed    Diet - low sodium heart healthy  As directed    Discharge instructions  As directed    Comments:     Wait until Sunday 03/03/13 to re-start Metformin   Driving Restrictions  As directed  Comments:     No driving for 3 days   Increase activity slowly  As directed    Increase activity slowly  As directed    Lifting restrictions  As directed    Comments:     No lifting more than 1/2  gallon of milk for 3 days       Medication List         ACCU-CHEK FASTCLIX LANCETS Misc  1 kit by Does not apply route 2 (two) times daily.     albuterol 108 (90 BASE) MCG/ACT inhaler  Commonly known as:  PROVENTIL HFA;VENTOLIN HFA  Inhale 2 puffs into the lungs every 6 (six) hours as needed. For shortness of breath     aspirin 81 MG chewable tablet  Chew 81 mg by mouth daily.     cholecalciferol 1000 UNITS tablet  Commonly known as:  VITAMIN D  Take 1,000 Units by mouth daily.     clopidogrel 75 MG tablet  Commonly known as:  PLAVIX  Take 1 tablet (75 mg total) by mouth daily with breakfast.     cyanocobalamin 500 MCG tablet  Take 500 mcg by mouth daily.     ezetimibe 10 MG tablet  Commonly known as:  ZETIA  Take 10 mg by mouth daily.     glucose blood test strip  1 each by Other route as needed for other. Use as instructed     insulin aspart 100 UNIT/ML injection  Commonly known as:  novoLOG  Inject 4 Units into the skin 3 (three) times daily before meals.     insulin glargine 100 UNIT/ML injection  Commonly known as:  LANTUS  Inject 65 Units into the skin at bedtime.     lisinopril-hydrochlorothiazide 20-25 MG per tablet  Commonly known as:  PRINZIDE,ZESTORETIC  Take 1 tablet by mouth daily.     metFORMIN 500 MG tablet  Commonly known as:  GLUCOPHAGE  Take 500 mg by mouth daily with breakfast.     NOVOFINE 32G X 6 MM Misc  Generic drug:  Insulin Pen Needle     omeprazole 20 MG capsule  Commonly known as:  PRILOSEC  Take 20 mg by mouth daily.     oxycodone 30 MG immediate release tablet  Commonly known as:  ROXICODONE  Take 30 mg by mouth 4 (four) times daily.     rosuvastatin 40 MG tablet  Commonly known as:  CRESTOR  Take 40 mg by mouth daily.       Follow-up Information   Follow up with Lorretta Harp, MD. (our office will call you with an appointment)    Specialty:  Cardiology   Contact information:   [address] [place]  [address] [place] [address] [phone]     [place]: [place] over meds and results  Signed: Lyda Jester 03/01/2013, 8:19 AM  I have examined the patient and reviewed assessment and plan and discussed with patient.  Agree with above as stated.  Left foot pulses palpable.  F/u w/ Dr. Gwenlyn Found.  Robert Sirico S.

## 2013-03-04 ENCOUNTER — Telehealth (HOSPITAL_COMMUNITY): Payer: Self-pay | Admitting: *Deleted

## 2013-03-07 ENCOUNTER — Encounter (HOSPITAL_COMMUNITY): Payer: Medicare Other

## 2013-03-12 ENCOUNTER — Ambulatory Visit (HOSPITAL_COMMUNITY)
Admission: RE | Admit: 2013-03-12 | Discharge: 2013-03-12 | Disposition: A | Discharge status: Home / Self Care | Payer: Medicare Other | Source: Ambulatory Visit | Attending: Cardiology | Admitting: Cardiology

## 2013-03-12 DIAGNOSIS — Z09 Encounter for follow-up examination after completed treatment for conditions other than malignant neoplasm: Secondary | ICD-10-CM | POA: Insufficient documentation

## 2013-03-12 DIAGNOSIS — Z9889 Other specified postprocedural states: Secondary | ICD-10-CM | POA: Insufficient documentation

## 2013-03-12 DIAGNOSIS — I739 Peripheral vascular disease, unspecified: Secondary | ICD-10-CM

## 2013-03-12 NOTE — Progress Notes (Signed)
Left Lower Extremity Arterial Duplex Completed. °Brianna L Mazza,RVT °

## 2013-04-02 ENCOUNTER — Ambulatory Visit: Payer: Medicare Other | Admitting: Cardiovascular Disease

## 2013-04-08 ENCOUNTER — Encounter: Payer: Self-pay | Admitting: *Deleted

## 2013-04-08 ENCOUNTER — Telehealth: Payer: Self-pay | Admitting: *Deleted

## 2013-04-08 DIAGNOSIS — I739 Peripheral vascular disease, unspecified: Secondary | ICD-10-CM

## 2013-04-08 NOTE — Telephone Encounter (Signed)
Order placed for repeat lower extremity arterial doppler in 6 months  

## 2013-04-08 NOTE — Telephone Encounter (Signed)
Message copied by Chauncy Lean on Mon Apr 08, 2013  1:06 PM ------      Message from: Lorretta Harp      Created: Mon Mar 25, 2013  8:28 PM       Improved LSFA velocities and LABI s/p intervention. Repeat in 6 months ------

## 2013-05-28 ENCOUNTER — Telehealth: Payer: Self-pay | Admitting: Cardiovascular Disease

## 2013-06-13 NOTE — Telephone Encounter (Signed)
Closed encounter °

## 2013-09-26 ENCOUNTER — Inpatient Hospital Stay (HOSPITAL_COMMUNITY): Admission: RE | Admit: 2013-09-26 | Payer: Medicare Other | Source: Ambulatory Visit

## 2014-01-05 ENCOUNTER — Other Ambulatory Visit: Payer: Self-pay | Admitting: Cardiology

## 2014-01-06 NOTE — Telephone Encounter (Signed)
Rx refill sent to patient pharmacy   

## 2014-01-30 ENCOUNTER — Encounter (HOSPITAL_COMMUNITY): Payer: Self-pay | Admitting: Cardiovascular Disease

## 2014-03-25 ENCOUNTER — Other Ambulatory Visit: Payer: Self-pay | Admitting: Cardiovascular Disease

## 2014-03-26 ENCOUNTER — Other Ambulatory Visit: Payer: Self-pay | Admitting: Cardiovascular Disease

## 2014-03-26 NOTE — Telephone Encounter (Signed)
Rx refill sent to patient pharmacy  With note to make appt. Last OV 01/2013

## 2014-03-28 NOTE — Telephone Encounter (Signed)
plavix refilled #[id number] with 0 refills 03/26/14 - needs appmt - message sent to scheduler

## 2014-04-01 ENCOUNTER — Telehealth: Payer: Self-pay | Admitting: Cardiovascular Disease

## 2014-04-02 NOTE — Telephone Encounter (Signed)
Closed encounter °

## 2014-04-28 ENCOUNTER — Other Ambulatory Visit: Payer: Self-pay | Admitting: Cardiovascular Disease

## 2014-05-14 ENCOUNTER — Ambulatory Visit: Payer: Medicare Other | Admitting: Cardiovascular Disease

## 2014-09-09 ENCOUNTER — Encounter: Payer: Self-pay | Admitting: Cardiovascular Disease

## 2020-12-10 ENCOUNTER — Other Ambulatory Visit: Payer: Self-pay

## 2020-12-10 ENCOUNTER — Encounter (HOSPITAL_BASED_OUTPATIENT_CLINIC_OR_DEPARTMENT_OTHER): Payer: Self-pay

## 2020-12-10 ENCOUNTER — Emergency Department (HOSPITAL_BASED_OUTPATIENT_CLINIC_OR_DEPARTMENT_OTHER): Payer: Medicare Other

## 2020-12-10 ENCOUNTER — Emergency Department (HOSPITAL_BASED_OUTPATIENT_CLINIC_OR_DEPARTMENT_OTHER)
Admission: EM | Admit: 2020-12-10 | Discharge: 2020-12-10 | Disposition: A | Discharge status: Home / Self Care | Payer: Medicare Other | Attending: Emergency Medicine | Admitting: Emergency Medicine

## 2020-12-10 DIAGNOSIS — I10 Essential (primary) hypertension: Secondary | ICD-10-CM | POA: Diagnosis not present

## 2020-12-10 DIAGNOSIS — R072 Precordial pain: Secondary | ICD-10-CM | POA: Diagnosis not present

## 2020-12-10 DIAGNOSIS — Z79899 Other long term (current) drug therapy: Secondary | ICD-10-CM | POA: Insufficient documentation

## 2020-12-10 DIAGNOSIS — R079 Chest pain, unspecified: Secondary | ICD-10-CM

## 2020-12-10 DIAGNOSIS — F17210 Nicotine dependence, cigarettes, uncomplicated: Secondary | ICD-10-CM | POA: Diagnosis not present

## 2020-12-10 DIAGNOSIS — R600 Localized edema: Secondary | ICD-10-CM | POA: Diagnosis not present

## 2020-12-10 DIAGNOSIS — E119 Type 2 diabetes mellitus without complications: Secondary | ICD-10-CM | POA: Insufficient documentation

## 2020-12-10 LAB — BASIC METABOLIC PANEL
Anion gap: 9 (ref 5–15)
BUN: 23 mg/dL (ref 8–23)
CO2: 25 mmol/L (ref 22–32)
Calcium: 9 mg/dL (ref 8.9–10.3)
Chloride: 106 mmol/L (ref 98–111)
Creatinine, Ser: 2.49 mg/dL — ABNORMAL HIGH (ref 0.61–1.24)
GFR, Estimated: 28 mL/min — ABNORMAL LOW (ref >60)
Glucose, Bld: 118 mg/dL — ABNORMAL HIGH (ref 70–99)
Potassium: 3.7 mmol/L (ref 3.5–5.1)
Sodium: 140 mmol/L (ref 135–145)

## 2020-12-10 LAB — CBC
HCT: 36.4 % — ABNORMAL LOW (ref 39.0–52.0)
Hemoglobin: 11.5 g/dL — ABNORMAL LOW (ref 13.0–17.0)
MCH: 26.1 pg (ref 26.0–34.0)
MCHC: 31.6 g/dL (ref 30.0–36.0)
MCV: 82.5 fL (ref 80.0–100.0)
Platelets: 244 10*3/uL (ref 150–400)
RBC: 4.41 MIL/uL (ref 4.22–5.81)
RDW: 14.4 % (ref 11.5–15.5)
WBC: 12.3 10*3/uL — ABNORMAL HIGH (ref 4.0–10.5)
nRBC: 0 % (ref 0.0–0.2)

## 2020-12-10 LAB — TROPONIN I (HIGH SENSITIVITY)
Troponin I (High Sensitivity): 14 ng/L (ref <18)
Troponin I (High Sensitivity): 17 ng/L (ref <18)

## 2020-12-10 MED ORDER — LISINOPRIL 10 MG PO TABS
20.0000 mg | ORAL_TABLET | Freq: Once | ORAL | Status: AC
  Administered 2020-12-10: 20 mg via ORAL
  Filled 2020-12-10: qty 2

## 2020-12-10 MED ORDER — ACETAMINOPHEN 500 MG PO TABS: 1000.0000 mg | ORAL_TABLET | Freq: Once | ORAL | Status: DC

## 2020-12-10 MED ORDER — NITROGLYCERIN 2 % TD OINT
1.0000 [in_us] | TOPICAL_OINTMENT | Freq: Once | TRANSDERMAL | Status: AC
  Administered 2020-12-10: 1 [in_us] via TOPICAL
  Filled 2020-12-10: qty 1

## 2020-12-10 MED ORDER — ISOSORBIDE MONONITRATE ER 30 MG PO TB24: 30.0000 mg | ORAL_TABLET | Freq: Every day | ORAL | 0 refills | Status: AC

## 2020-12-10 MED ORDER — LISINOPRIL-HYDROCHLOROTHIAZIDE 20-25 MG PO TABS: 1.0000 | ORAL_TABLET | Freq: Once | ORAL | Status: DC

## 2020-12-10 MED ORDER — LABETALOL HCL 5 MG/ML IV SOLN
10.0000 mg | Freq: Once | INTRAVENOUS | Status: AC
  Administered 2020-12-10: 10 mg via INTRAVENOUS
  Filled 2020-12-10: qty 4

## 2020-12-10 MED ORDER — ASPIRIN 81 MG PO CHEW
324.0000 mg | CHEWABLE_TABLET | Freq: Once | ORAL | Status: AC
  Administered 2020-12-10: 324 mg via ORAL
  Filled 2020-12-10: qty 4

## 2020-12-10 MED ORDER — HYDROCHLOROTHIAZIDE 25 MG PO TABS
25.0000 mg | ORAL_TABLET | Freq: Once | ORAL | Status: AC
  Administered 2020-12-10: 25 mg via ORAL
  Filled 2020-12-10: qty 1

## 2020-12-10 NOTE — ED Triage Notes (Signed)
Pt reports episode of low BS ~8am that was resolved when he drank soda with sugar-c/o CP that started after event-NAD-steady gait

## 2020-12-10 NOTE — ED Provider Notes (Signed)
[facility] EMERGENCY DEPARTMENT Provider Note   CSN: [id number] Arrival date & time: 12/10/20  1043     History Chief Complaint  Patient presents with   Chest Pain    Robert Rocha is a 65 y.o. male.   Chest Pain Associated symptoms: no abdominal pain, no back pain, no cough, no dizziness, no fatigue, no fever, no headache, no nausea, no numbness, no palpitations, no shortness of breath, no vomiting and no weakness   Patient presents for chest pain.  He states that, upon awakening this morning, he was hypoglycemic.  He did get sugar containing fluids by mouth with resolution of his hypoglycemic symptoms.  He feels that he had this hypoglycemic episode due to not eating any protein last night.  He does take his 24-hour insulin at night.  Since this morning, patient has not had any recurrence of hypoglycemic symptoms.  He did, however, developed a substernal chest pain.  Pain did not radiate.  He did not have any associated shortness of breath, diaphoresis, or nausea.  Pain resolved 1 hour prior to being bedded in the ED.  Currently, he denies any symptoms.  He does see a cardiologist.  He does state that he has undergone stress testing, including within the past year.  Currently, he does not take ASA.  He is on 1 lisinopril-HCTZ combo medication for management of blood pressure.  He does not take any other diuretic medications.  He has had stable lower extremity edema.  His next cardiology appointment is scheduled for November 1. HPI: A 65 year old patient with a history of peripheral artery disease, treated diabetes, hypertension, hypercholesterolemia and obesity presents for evaluation of chest pain. Initial onset of pain was approximately 1-3 hours ago. The patient's chest pain is described as heaviness/pressure/tightness and is not worse with exertion. The patient's chest pain is middle- or left-sided, is not well-localized, is not sharp and does not radiate to the arms/jaw/neck.  The patient does not complain of nausea and denies diaphoresis. The patient has smoked in the past 90 days and has a family history of coronary artery disease in a first-degree relative with onset less than age 14. The patient has no history of stroke.   Past Medical History:  Diagnosis Date   Arthritis    Depression    Diabetes mellitus    insulin dependent   Fibromyalgia    GERD (gastroesophageal reflux disease)    Hyperlipidemia    Hypertension    Peripheral vascular disease ([place])     Patient Active Problem List   Diagnosis Date Noted   Claudication ([place]) 02/28/2013   PAD (peripheral artery disease): 11/22/11: PTA and stent to left SFA. 02/28/13: rotational attherectomy for ISR of SFA stent 11/23/2011   HTN (hypertension) 11/23/2011   Tobacco abuse 11/23/2011   HLD (hyperlipidemia) 11/23/2011   DM (diabetes mellitus) ([place]) 11/23/2011    Past Surgical History:  Procedure Laterality Date   ATHERECTOMY  02/28/2013   STENT    TO SFA   FEMORAL ARTERY STENT     LOWER EXTREMITY ANGIOGRAM N/A 11/22/2011   Procedure: LOWER EXTREMITY ANGIOGRAM;  Surgeon: Lorretta Harp, MD;  Location: [facility] CATH LAB;  Service: Cardiovascular;  Laterality: N/A;   LOWER EXTREMITY ANGIOGRAM N/A 02/28/2013   Procedure: LOWER EXTREMITY ANGIOGRAM;  Surgeon: Lorretta Harp, MD;  Location: [facility] CATH LAB;  Service: Cardiovascular;  Laterality: N/A;       No family history on file.  Social History   Tobacco Use  Smoking status: Every Day    Packs/day: 0.50    Years: 40.00    Pack years: 20.00    Types: Cigarettes   Smokeless tobacco: Never  Vaping Use   Vaping Use: Never used  Substance Use Topics   Alcohol use: No   Drug use: No    Home Medications   Allergies    Patient has no known allergies.  Review of Systems   Review of Systems  Constitutional:  Negative for activity change, chills, fatigue and fever.  HENT:  Negative for congestion, ear pain and sore throat.   Eyes:  Negative for  pain and visual disturbance.  Respiratory:  Negative for cough, chest tightness, shortness of breath and wheezing.   Cardiovascular:  Positive for chest pain and leg swelling. Negative for palpitations.  Gastrointestinal:  Negative for abdominal pain, nausea and vomiting.  Genitourinary:  Negative for dysuria, flank pain and hematuria.  Musculoskeletal:  Negative for arthralgias, back pain, myalgias and neck pain.  Skin:  Negative for color change and rash.  Neurological:  Negative for dizziness, seizures, syncope, weakness, light-headedness, numbness and headaches.  All other systems reviewed and are negative.  Physical Exam Updated Vital Signs BP (!) 180/80   Pulse 74   Temp 98.6 F (37 C) (Oral)   Resp 17   Ht '5\' 10"'$  (1.778 m)   Wt 115.7 kg   SpO2 99%   BMI 36.59 kg/m   Physical Exam Vitals and nursing note reviewed.  Constitutional:      General: He is not in acute distress.    Appearance: He is well-developed. He is not ill-appearing, toxic-appearing or diaphoretic.  HENT:     Head: Normocephalic and atraumatic.  Eyes:     Extraocular Movements: Extraocular movements intact.     Conjunctiva/sclera: Conjunctivae normal.  Neck:     Vascular: No JVD.  Cardiovascular:     Rate and Rhythm: Normal rate and regular rhythm.     Heart sounds: Normal heart sounds. No murmur heard. Pulmonary:     Effort: Pulmonary effort is normal. No respiratory distress.     Breath sounds: Normal breath sounds. No decreased breath sounds, wheezing or rales.  Chest:     Chest wall: No tenderness or edema.  Abdominal:     Palpations: Abdomen is soft.     Tenderness: There is no abdominal tenderness.  Musculoskeletal:     Cervical back: Normal range of motion and neck supple.     Right lower leg: No tenderness. Edema present.     Left lower leg: No tenderness. Edema present.  Skin:    General: Skin is warm and dry.  Neurological:     General: No focal deficit present.     Mental Status:  He is alert and oriented to person, place, and time.     Cranial Nerves: No cranial nerve deficit.     Motor: No weakness.  Psychiatric:        Mood and Affect: Mood normal.        Behavior: Behavior normal.    ED Results / Procedures / Treatments   Labs (all labs ordered are listed, but only abnormal results are displayed) Labs Reviewed  BASIC METABOLIC PANEL - Abnormal; Notable for the following components:      Result Value   Glucose, Bld 118 (*)    Creatinine, Ser 2.49 (*)    GFR, Estimated 28 (*)    All other components within normal limits  CBC - Abnormal;  Notable for the following components:   WBC 12.3 (*)    Hemoglobin 11.5 (*)    HCT 36.4 (*)    All other components within normal limits  TROPONIN I (HIGH SENSITIVITY)  TROPONIN I (HIGH SENSITIVITY)    EKG EKG Interpretation  Date/Time:  Thursday December 10 2020 11:28:59 EDT Ventricular Rate:  90 PR Interval:  130 QRS Duration: 86 QT Interval:  368 QTC Calculation: 450 R Axis:   75 Text Interpretation: Sinus rhythm with sinus arrhythmia with occasional Premature ventricular complexes Otherwise normal ECG Confirmed by Godfrey Pick [phone]) on 12/10/2020 11:55:51 AM  Radiology DG Chest 2 View  Result Date: 12/10/2020 CLINICAL DATA:  Chest pain. Additional history provided: Patient reports low blood sugar, mid chest pain, history of diabetes and hypertension. EXAM: CHEST - 2 VIEW COMPARISON:  Prior chest radiographs 01/13/2017 and earlier. FINDINGS: Heart size within normal limits. No appreciable airspace consolidation. No evidence of pleural effusion or pneumothorax. No acute bony abnormality identified. IMPRESSION: No evidence of active cardiopulmonary disease. Electronically Signed   By: Kellie Simmering D.O.   On: 12/10/2020 12:33    Procedures Procedures   Medications Ordered in ED Medications  aspirin chewable tablet 324 mg (324 mg Oral Given 12/10/20 1227)  lisinopril (ZESTRIL) tablet 20 mg (20 mg Oral Given  12/10/20 1224)  hydrochlorothiazide (HYDRODIURIL) tablet 25 mg (25 mg Oral Given 12/10/20 1224)  nitroGLYCERIN (NITROGLYN) 2 % ointment 1 inch (1 inch Topical Given 12/10/20 1402)  labetalol (NORMODYNE) injection 10 mg (10 mg Intravenous Given 12/10/20 1434)    ED Course  I have reviewed the triage vital signs and the nursing notes.  Pertinent labs & imaging results that were available during my care of the patient were reviewed by me and considered in my medical decision making (see chart for details).    MDM Rules/Calculators/A&P HEAR Score: 5                        Patient is a 65 year old male presenting for transient episode of chest pain this morning.  Vital signs upon arrival notable for hypertension.  He states that he has not taken any of his morning medications.  Patient's home dose of lisinopril and HCTZ were given.  324 of ASA was given due to concern of possible ACS.  EKG did not show any evidence of ischemia.  Laboratory work-up was initiated, including troponins.  Initial troponin was 14.  Patient continued to deny any current symptoms on reassessment.  While in the ED, patient's blood pressure did increase to the range of 200s over 80s.  This was despite his home blood pressure medications.  Nitroglycerin and labetalol were given with subsequent improvement in blood pressure.  Patient's repeat troponin was 17.  He does state that he has undergone cardiac stress testing in the past, including within the past year, although I cannot see these results in EMR.  Shared decision-making conversation was had with the patient.  He has felt well during his entire observation in the ED.  He does have a cardiology appointment scheduled for November 1.  He does wish to go home.  Given his elevated blood pressure in the ED, patient was given a 30-day prescription for Imdur.  He was advised to discuss blood pressure medications with his cardiologist at his follow-up appointment.Marland Kitchen  He was also  encouraged to discuss possible initiation of diuretic, given his BLE pitting edema.  Patient to also discuss further outpatient testing with  his cardiologist.  He was encouraged to return to the ED if he has any recurrence of concerning symptoms.  He was discharged in stable condition.  Final Clinical Impression(s) / ED Diagnoses Final diagnoses:  Chest pain, unspecified type    Rx / DC Orders ED Discharge Orders          Ordered    isosorbide mononitrate (IMDUR) 30 MG 24 hr tablet  Daily        12/10/20 1507             Godfrey Pick, MD 12/10/20 2254

## 2020-12-10 NOTE — Discharge Instructions (Addendum)
There is a prescription attached for Imdur.  This is a medication to treat high blood pressure.  Take this, in addition to the blood pressure medication that you already take.  Check your blood pressure at home and ensure that it does not drop too low.  Low blood pressure can be life-threatening.  If you experience low blood pressure or symptoms such as lightheaded or dizziness, stop taking this medication.  Keep your appointment with your primary care doctor/cardiologist for the first.  During that visit, talk to him about blood pressure medications going forward.  You can also talk to him about diuretic medications that may improve your leg swelling.  If you experience any return of concerning symptoms, please return to the emergency department.

## 2020-12-13 ENCOUNTER — Other Ambulatory Visit: Payer: Self-pay

## 2020-12-13 ENCOUNTER — Encounter (HOSPITAL_BASED_OUTPATIENT_CLINIC_OR_DEPARTMENT_OTHER): Payer: Self-pay | Admitting: Emergency Medicine

## 2020-12-13 ENCOUNTER — Observation Stay (HOSPITAL_BASED_OUTPATIENT_CLINIC_OR_DEPARTMENT_OTHER)
Admission: EM | Admit: 2020-12-13 | Discharge: 2020-12-15 | Disposition: A | Discharge status: Home / Self Care | Payer: Medicare Other | Attending: Family Medicine | Admitting: Family Medicine

## 2020-12-13 ENCOUNTER — Emergency Department (HOSPITAL_BASED_OUTPATIENT_CLINIC_OR_DEPARTMENT_OTHER): Payer: Medicare Other

## 2020-12-13 DIAGNOSIS — Z79899 Other long term (current) drug therapy: Secondary | ICD-10-CM | POA: Insufficient documentation

## 2020-12-13 DIAGNOSIS — F17210 Nicotine dependence, cigarettes, uncomplicated: Secondary | ICD-10-CM | POA: Diagnosis not present

## 2020-12-13 DIAGNOSIS — E785 Hyperlipidemia, unspecified: Secondary | ICD-10-CM | POA: Diagnosis present

## 2020-12-13 DIAGNOSIS — Z20822 Contact with and (suspected) exposure to covid-19: Secondary | ICD-10-CM | POA: Insufficient documentation

## 2020-12-13 DIAGNOSIS — Z7982 Long term (current) use of aspirin: Secondary | ICD-10-CM | POA: Diagnosis not present

## 2020-12-13 DIAGNOSIS — I739 Peripheral vascular disease, unspecified: Secondary | ICD-10-CM | POA: Diagnosis present

## 2020-12-13 DIAGNOSIS — I129 Hypertensive chronic kidney disease with stage 1 through stage 4 chronic kidney disease, or unspecified chronic kidney disease: Secondary | ICD-10-CM | POA: Diagnosis not present

## 2020-12-13 DIAGNOSIS — Z720 Tobacco use: Secondary | ICD-10-CM | POA: Diagnosis present

## 2020-12-13 DIAGNOSIS — R072 Precordial pain: Secondary | ICD-10-CM | POA: Diagnosis present

## 2020-12-13 DIAGNOSIS — N179 Acute kidney failure, unspecified: Secondary | ICD-10-CM

## 2020-12-13 DIAGNOSIS — Z794 Long term (current) use of insulin: Secondary | ICD-10-CM | POA: Diagnosis not present

## 2020-12-13 DIAGNOSIS — Z7902 Long term (current) use of antithrombotics/antiplatelets: Secondary | ICD-10-CM | POA: Insufficient documentation

## 2020-12-13 DIAGNOSIS — R0602 Shortness of breath: Secondary | ICD-10-CM | POA: Diagnosis not present

## 2020-12-13 DIAGNOSIS — K219 Gastro-esophageal reflux disease without esophagitis: Secondary | ICD-10-CM | POA: Diagnosis present

## 2020-12-13 DIAGNOSIS — E1122 Type 2 diabetes mellitus with diabetic chronic kidney disease: Secondary | ICD-10-CM | POA: Insufficient documentation

## 2020-12-13 DIAGNOSIS — R079 Chest pain, unspecified: Secondary | ICD-10-CM | POA: Diagnosis present

## 2020-12-13 DIAGNOSIS — E119 Type 2 diabetes mellitus without complications: Secondary | ICD-10-CM

## 2020-12-13 DIAGNOSIS — N184 Chronic kidney disease, stage 4 (severe): Secondary | ICD-10-CM | POA: Diagnosis not present

## 2020-12-13 DIAGNOSIS — R609 Edema, unspecified: Secondary | ICD-10-CM

## 2020-12-13 DIAGNOSIS — I160 Hypertensive urgency: Secondary | ICD-10-CM | POA: Diagnosis present

## 2020-12-13 LAB — BASIC METABOLIC PANEL
Anion gap: 7 (ref 5–15)
BUN: 21 mg/dL (ref 8–23)
CO2: 26 mmol/L (ref 22–32)
Calcium: 8.9 mg/dL (ref 8.9–10.3)
Chloride: 105 mmol/L (ref 98–111)
Creatinine, Ser: 2.7 mg/dL — ABNORMAL HIGH (ref 0.61–1.24)
GFR, Estimated: 25 mL/min — ABNORMAL LOW (ref >60)
Glucose, Bld: 94 mg/dL (ref 70–99)
Potassium: 4.3 mmol/L (ref 3.5–5.1)
Sodium: 138 mmol/L (ref 135–145)

## 2020-12-13 LAB — CBC
HCT: 34.5 % — ABNORMAL LOW (ref 39.0–52.0)
Hemoglobin: 11 g/dL — ABNORMAL LOW (ref 13.0–17.0)
MCH: 26.2 pg (ref 26.0–34.0)
MCHC: 31.9 g/dL (ref 30.0–36.0)
MCV: 82.1 fL (ref 80.0–100.0)
Platelets: 223 10*3/uL (ref 150–400)
RBC: 4.2 MIL/uL — ABNORMAL LOW (ref 4.22–5.81)
RDW: 14.1 % (ref 11.5–15.5)
WBC: 9.4 10*3/uL (ref 4.0–10.5)
nRBC: 0 % (ref 0.0–0.2)

## 2020-12-13 LAB — TROPONIN I (HIGH SENSITIVITY)
Troponin I (High Sensitivity): 50 ng/L — ABNORMAL HIGH (ref <18)
Troponin I (High Sensitivity): 50 ng/L — ABNORMAL HIGH (ref <18)

## 2020-12-13 LAB — RESP PANEL BY RT-PCR (FLU A&B, COVID) ARPGX2
Influenza A by PCR: NEGATIVE
Influenza B by PCR: NEGATIVE
SARS Coronavirus 2 by RT PCR: NEGATIVE

## 2020-12-13 LAB — BRAIN NATRIURETIC PEPTIDE: B Natriuretic Peptide: 34.2 pg/mL (ref 0.0–100.0)

## 2020-12-13 MED ORDER — ASPIRIN 81 MG PO CHEW
81.0000 mg | CHEWABLE_TABLET | Freq: Every day | ORAL | Status: DC
  Administered 2020-12-13 – 2020-12-15 (×3): 81 mg via ORAL
  Filled 2020-12-13 (×3): qty 1

## 2020-12-13 NOTE — ED Provider Notes (Signed)
[facility] EMERGENCY DEPARTMENT Provider Note   CSN: [id number] Arrival date & time: 12/13/20  1312     History Chief Complaint  Patient presents with   Chest Pain    Robert Rocha is a 65 y.o. male.  HPI Patient is a 65 year old male with past medical history significant for DM2, smoker, HLD, HTN, obesity, first-degree relative who died of "massive heart attack "in early 50s--mother  States that he woke up this morning feeling well walked to breakfast table started having crushing chest pain and pressure states that this continued for 45 minutes to 1 hour states that he was diaphoretic at 1 point did not feel particularly short of breath and did not have any nausea or vomiting.  States that his symptoms resolved he came to the ER for evaluation of the symptoms.  He states that this episode began around 8 AM this morning.  3 days ago patient had some substernal chest pain that was nonradiating and had no associate shortness of breath diaphoresis or nausea.  Patient does see a cardiologist at [facility].  States that he has not had a catheterization done of the heart however he did have a stress test within the past year perhaps closer to 1 year ago.  He states it was normal at that time.   HPI: A 65 year old patient with a history of peripheral artery disease, treated diabetes, hypertension, hypercholesterolemia and obesity presents for evaluation of chest pain. Initial onset of pain was approximately 3-6 hours ago. The patient's chest pain is described as heaviness/pressure/tightness and is not worse with exertion. The patient's chest pain is middle- or left-sided, is not well-localized, is not sharp and does not radiate to the arms/jaw/neck. The patient does not complain of nausea and denies diaphoresis. The patient has smoked in the past 90 days and has a family history of coronary artery disease in a first-degree relative with onset less than age 68. The patient  has no history of stroke.   Past Medical History:  Diagnosis Date   Arthritis    Depression    Diabetes mellitus    insulin dependent   Fibromyalgia    GERD (gastroesophageal reflux disease)    Hyperlipidemia    Hypertension    Peripheral vascular disease ([place])     Patient Active Problem List   Diagnosis Date Noted   Chest pain 12/13/2020   Claudication ([place]) 02/28/2013   PAD (peripheral artery disease): 11/22/11: PTA and stent to left SFA. 02/28/13: rotational attherectomy for ISR of SFA stent 11/23/2011   HTN (hypertension) 11/23/2011   Tobacco abuse 11/23/2011   HLD (hyperlipidemia) 11/23/2011   DM (diabetes mellitus) ([place]) 11/23/2011    Past Surgical History:  Procedure Laterality Date   ATHERECTOMY  02/28/2013   STENT    TO SFA   FEMORAL ARTERY STENT     LOWER EXTREMITY ANGIOGRAM N/A 11/22/2011   Procedure: LOWER EXTREMITY ANGIOGRAM;  Surgeon: Lorretta Harp, MD;  Location: [facility] CATH LAB;  Service: Cardiovascular;  Laterality: N/A;   LOWER EXTREMITY ANGIOGRAM N/A 02/28/2013   Procedure: LOWER EXTREMITY ANGIOGRAM;  Surgeon: Lorretta Harp, MD;  Location: [facility] CATH LAB;  Service: Cardiovascular;  Laterality: N/A;       No family history on file.  Social History   Tobacco Use   Smoking status: Every Day    Packs/day: 0.50    Years: 40.00    Pack years: 20.00    Types: Cigarettes   Smokeless tobacco: Never  Vaping  Use   Vaping Use: Never used  Substance Use Topics   Alcohol use: No   Drug use: No    Home Medications Prior to Admission medications   Medication Sig Start Date End Date Taking? Authorizing Provider  ACCU-CHEK FASTCLIX LANCETS MISC 1 kit by Does not apply route 2 (two) times daily.    [provider]  albuterol (PROVENTIL HFA;VENTOLIN HFA) 108 (90 BASE) MCG/ACT inhaler Inhale 2 puffs into the lungs every 6 (six) hours as needed. For shortness of breath    [provider]  aspirin 81 MG chewable tablet Chew 81 mg by mouth daily.     [provider]  cholecalciferol (VITAMIN D) 1000 UNITS tablet Take 1,000 Units by mouth daily.    [provider]  clopidogrel (PLAVIX) 75 MG tablet TAKE ONE TABLET BY MOUTH DAILY   MUST MAKE APPOINTMENT FOR FURTHER REFILLS  04/28/14   Lorretta Harp, MD  cyanocobalamin 500 MCG tablet Take 500 mcg by mouth daily.    [provider]  ezetimibe (ZETIA) 10 MG tablet Take 10 mg by mouth daily.    [provider]  glucose blood test strip 1 each by Other route as needed for other. Use as instructed    [provider]  insulin aspart (NOVOLOG) 100 UNIT/ML injection Inject 4 Units into the skin 3 (three) times daily before meals.    [provider]  insulin glargine (LANTUS) 100 UNIT/ML injection Inject 65 Units into the skin at bedtime.    [provider]  isosorbide mononitrate (IMDUR) 30 MG 24 hr tablet Take 1 tablet (30 mg total) by mouth daily. 12/10/20 01/09/21  Godfrey Pick, MD  lisinopril-hydrochlorothiazide (PRINZIDE,ZESTORETIC) 20-25 MG per tablet Take 1 tablet by mouth daily.    [provider]  metFORMIN (GLUCOPHAGE) 500 MG tablet Take 500 mg by mouth daily with breakfast.  01/14/13   [provider]  NOVOFINE 32G X 6 MM MISC  12/14/12   [provider]  omeprazole (PRILOSEC) 20 MG capsule Take 20 mg by mouth daily.    [provider]  oxycodone (ROXICODONE) 30 MG immediate release tablet Take 30 mg by mouth 4 (four) times daily.    [provider]  rosuvastatin (CRESTOR) 40 MG tablet Take 40 mg by mouth daily.    [provider]    Allergies    Patient has no known allergies.  Review of Systems   Review of Systems  Constitutional:  Positive for diaphoresis. Negative for chills and fever.  HENT:  Negative for congestion.   Eyes:  Negative for pain.  Respiratory:  Negative for cough and shortness of breath.   Cardiovascular:  Positive for chest pain. Negative for leg  swelling.  Gastrointestinal:  Negative for abdominal pain and vomiting.  Genitourinary:  Negative for dysuria.  Musculoskeletal:  Negative for myalgias.  Skin:  Negative for rash.  Neurological:  Negative for dizziness and headaches.   Physical Exam Updated Vital Signs BP (!) 182/75   Pulse 70   Temp 98.2 F (36.8 C) (Oral)   Resp 19   Ht _0  (1.778 m)   Wt 116.6 kg   SpO2 100%   BMI 36.88 kg/m   Physical Exam Vitals and nursing note reviewed.  Constitutional:      General: He is not in acute distress.    Appearance: He is obese.  HENT:     Head: Normocephalic and atraumatic.     Nose: Nose normal.  Mouth/Throat:     Mouth: Mucous membranes are moist.  Eyes:     General: No scleral icterus. Cardiovascular:     Rate and Rhythm: Normal rate and regular rhythm.     Pulses: Normal pulses.     Heart sounds: Normal heart sounds.     Comments: Bilateral radial artery pulses 3+ and symmetric Pulmonary:     Effort: Pulmonary effort is normal. No respiratory distress.     Breath sounds: No wheezing.  Abdominal:     Palpations: Abdomen is soft.     Tenderness: There is no abdominal tenderness. There is no guarding or rebound.  Musculoskeletal:     Cervical back: Normal range of motion.     Right lower leg: Edema present.     Left lower leg: Edema present.     Comments: Bilateral lower extremity edema right greater than left. 2+ to the knee on the right leg ; and 2+ to the ankle and the left lower extremity with scant edema to the knee  Skin:    General: Skin is warm and dry.     Capillary Refill: Capillary refill takes less than 2 seconds.  Neurological:     Mental Status: He is alert. Mental status is at baseline.  Psychiatric:        Mood and Affect: Mood normal.        Behavior: Behavior normal.    ED Results / Procedures / Treatments   Labs (all labs ordered are listed, but only abnormal results are displayed) Labs Reviewed  BASIC METABOLIC PANEL -  Abnormal; Notable for the following components:      Result Value   Creatinine, Ser 2.70 (*)    GFR, Estimated 25 (*)    All other components within normal limits  CBC - Abnormal; Notable for the following components:   RBC 4.20 (*)    Hemoglobin 11.0 (*)    HCT 34.5 (*)    All other components within normal limits  TROPONIN I (HIGH SENSITIVITY) - Abnormal; Notable for the following components:   Troponin I (High Sensitivity) 50 (*)    All other components within normal limits  TROPONIN I (HIGH SENSITIVITY) - Abnormal; Notable for the following components:   Troponin I (High Sensitivity) 50 (*)    All other components within normal limits  RESP PANEL BY RT-PCR (FLU A&B, COVID) ARPGX2  BRAIN NATRIURETIC PEPTIDE    EKG EKG Interpretation  Date/Time:  Sunday December 13 2020 13:19:22 EDT Ventricular Rate:  62 PR Interval:  134 QRS Duration: 98 QT Interval:  398 QTC Calculation: 403 R Axis:   68 Text Interpretation: Normal sinus rhythm Normal ECG Confirmed by Fredia Sorrow [phone]) on 12/13/2020 3:44:57 PM  Radiology DG Chest 2 View  Result Date: 12/13/2020 CLINICAL DATA:  Chest pain EXAM: CHEST - 2 VIEW COMPARISON:  Chest radiograph dated 12/10/2020 FINDINGS: The heart size and mediastinal contours are within normal limits. Both lungs are clear. The visualized skeletal structures are unremarkable. IMPRESSION: No active cardiopulmonary disease. Electronically Signed   By: Zerita Boers M.D.   On: 12/13/2020 17:15   US Venous Img Lower Bilateral (DVT)  Result Date: 12/13/2020 CLINICAL DATA:  Leg swelling.  Chest pain. EXAM: BILATERAL LOWER EXTREMITY VENOUS DOPPLER ULTRASOUND TECHNIQUE: Gray-scale sonography with graded compression, as well as color Doppler and duplex ultrasound were performed to evaluate the lower extremity deep venous systems from the level of the common femoral vein and including the common femoral, femoral, profunda femoral, popliteal  and calf veins including  the posterior tibial, peroneal and gastrocnemius veins when visible. The superficial great saphenous vein was also interrogated. Spectral Doppler was utilized to evaluate flow at rest and with distal augmentation maneuvers in the common femoral, femoral and popliteal veins. COMPARISON:  None. FINDINGS: RIGHT LOWER EXTREMITY Common Femoral Vein: No evidence of thrombus. Normal compressibility, respiratory phasicity and response to augmentation. Saphenofemoral Junction: No evidence of thrombus. Normal compressibility and flow on color Doppler imaging. Profunda Femoral Vein: No evidence of thrombus. Normal compressibility and flow on color Doppler imaging. Femoral Vein: No evidence of thrombus. Normal compressibility, respiratory phasicity and response to augmentation. Popliteal Vein: No evidence of thrombus. Normal compressibility, respiratory phasicity and response to augmentation. Calf Veins: No evidence of thrombus. Normal compressibility and flow on color Doppler imaging. Superficial Great Saphenous Vein: No evidence of thrombus. Normal compressibility. Venous Reflux:  None. Other Findings:  None. LEFT LOWER EXTREMITY Common Femoral Vein: No evidence of thrombus. Normal compressibility, respiratory phasicity and response to augmentation. Saphenofemoral Junction: No evidence of thrombus. Normal compressibility and flow on color Doppler imaging. Profunda Femoral Vein: No evidence of thrombus. Normal compressibility and flow on color Doppler imaging. Femoral Vein: No evidence of thrombus. Normal compressibility, respiratory phasicity and response to augmentation. Popliteal Vein: No evidence of thrombus. Normal compressibility, respiratory phasicity and response to augmentation. Calf Veins: No evidence of thrombus. Normal compressibility and flow on color Doppler imaging. Superficial Great Saphenous Vein: No evidence of thrombus. Normal compressibility. Venous Reflux:  None. Other Findings:  None. IMPRESSION: No  evidence of deep venous thrombosis in either lower extremity. Electronically Signed   By: Iven Finn M.D.   On: 12/13/2020 16:59    Procedures Procedures   Medications Ordered in ED Medications  aspirin chewable tablet 81 mg (81 mg Oral Given 12/13/20 1850)    ED Course  I have reviewed the triage vital signs and the nursing notes.  Pertinent labs & imaging results that were available during my care of the patient were reviewed by me and considered in my medical decision making (see chart for details).  Clinical Course as of 12/13/20 2015  Sun Dec 13, 2020  1719 Lower extremity ultrasounds negative for DVT. [WF]  1601 Chest x-ray unremarkable 2 view.  Agree w Radiology read [WF]  [phone] Patient reports to me that he had a stress test approximately 6 months ago that was normal. [WF]  [place] Discussed with Dr. Humphrey Rolls of cardiology who recommends for admit and stress test tomorrow.  [WF]    Clinical Course User Index [WF] Tedd Sias, PA   MDM Rules/Calculators/A&P HEAR Score: 6                        Patient is 65 year old gentleman with multiple risk factors for ACS has not had a cath done he states but did have a stress test done maybe 6 months ago which was normal.  Does follow with [facility] and cardiology.  Seems that he has had 2 episodes now of chest pain over the past 3 days both occurred in the morning.  This morning he woke up around 8 AM and soon after had crushing chest pain and diaphoresis after walking to his kitchen table.  He states that his symptoms last obtained 45 minutes 1 hour completely resolved.  He has no symptoms currently however troponin obtained today increased from 17 3 days ago to 50 x 2  Stable troponins at this time.  EKG without any evidence of acute ischemia.  CBC with mild anemia BMP with slightly increasing creatinine does not appear to be in an acute kidney injury.  BMP unremarkable.  COVID influenza negative.  Chest x-ray  unremarkable.  Bilateral lower extremity ultrasounds negative for DVT.  Doubt pulmonary embolism as cause of patient's symptoms.  The be very atypical sounding symptoms for PE and with bilateral duplex negative I am reassured.  He does have some asymmetric edema on my examination.  Otherwise physical exam relatively unremarkable.  Discussed with cardiology who recommends admission.  Discussed with Dr. Myna Hidalgo who will admit patient.  Patient agreeable to plan.  All questions answered the best my ability.  Cardiology recommendation was for stress test tomorrow.  Final Clinical Impression(s) / ED Diagnoses Final diagnoses:  Swelling  Chest pain, unspecified type    Rx / DC Orders ED Discharge Orders     None        Tedd Sias, [place] 12/13/20 2021    Fredia Sorrow, MD 12/16/20 [phone]

## 2020-12-13 NOTE — ED Notes (Signed)
Lab phoned to add BNP to lab orders

## 2020-12-13 NOTE — ED Notes (Signed)
US at bedside

## 2020-12-13 NOTE — ED Notes (Signed)
Pt c/o intermittent L chest pain x "a while."  Denies associated symptoms expect headache.  Currently denies pain.

## 2020-12-13 NOTE — ED Notes (Signed)
Pt provided ice water per request.

## 2020-12-13 NOTE — ED Notes (Signed)
ED Provider at bedside. 

## 2020-12-13 NOTE — ED Triage Notes (Signed)
Pt c/o left sided chest pain onset this morning while sitting watching TV. Pt denies any other symptoms, reports feels same as when he was here 3 days ago.

## 2020-12-14 ENCOUNTER — Observation Stay (HOSPITAL_BASED_OUTPATIENT_CLINIC_OR_DEPARTMENT_OTHER): Payer: Medicare Other

## 2020-12-14 ENCOUNTER — Encounter (HOSPITAL_BASED_OUTPATIENT_CLINIC_OR_DEPARTMENT_OTHER): Payer: Self-pay | Admitting: Family Medicine

## 2020-12-14 ENCOUNTER — Observation Stay (HOSPITAL_COMMUNITY): Payer: Medicare Other

## 2020-12-14 DIAGNOSIS — Z794 Long term (current) use of insulin: Secondary | ICD-10-CM | POA: Diagnosis not present

## 2020-12-14 DIAGNOSIS — Z7902 Long term (current) use of antithrombotics/antiplatelets: Secondary | ICD-10-CM | POA: Diagnosis not present

## 2020-12-14 DIAGNOSIS — R072 Precordial pain: Secondary | ICD-10-CM | POA: Diagnosis present

## 2020-12-14 DIAGNOSIS — E1122 Type 2 diabetes mellitus with diabetic chronic kidney disease: Secondary | ICD-10-CM | POA: Diagnosis not present

## 2020-12-14 DIAGNOSIS — Z79899 Other long term (current) drug therapy: Secondary | ICD-10-CM | POA: Diagnosis not present

## 2020-12-14 DIAGNOSIS — K219 Gastro-esophageal reflux disease without esophagitis: Secondary | ICD-10-CM | POA: Diagnosis present

## 2020-12-14 DIAGNOSIS — E782 Mixed hyperlipidemia: Secondary | ICD-10-CM | POA: Diagnosis not present

## 2020-12-14 DIAGNOSIS — E1169 Type 2 diabetes mellitus with other specified complication: Secondary | ICD-10-CM

## 2020-12-14 DIAGNOSIS — R079 Chest pain, unspecified: Secondary | ICD-10-CM | POA: Diagnosis not present

## 2020-12-14 DIAGNOSIS — I160 Hypertensive urgency: Secondary | ICD-10-CM | POA: Diagnosis not present

## 2020-12-14 DIAGNOSIS — N189 Chronic kidney disease, unspecified: Secondary | ICD-10-CM | POA: Insufficient documentation

## 2020-12-14 DIAGNOSIS — Z20822 Contact with and (suspected) exposure to covid-19: Secondary | ICD-10-CM | POA: Diagnosis not present

## 2020-12-14 DIAGNOSIS — Z7982 Long term (current) use of aspirin: Secondary | ICD-10-CM | POA: Diagnosis not present

## 2020-12-14 DIAGNOSIS — F17210 Nicotine dependence, cigarettes, uncomplicated: Secondary | ICD-10-CM | POA: Diagnosis not present

## 2020-12-14 DIAGNOSIS — N179 Acute kidney failure, unspecified: Secondary | ICD-10-CM | POA: Diagnosis not present

## 2020-12-14 DIAGNOSIS — E785 Hyperlipidemia, unspecified: Secondary | ICD-10-CM

## 2020-12-14 DIAGNOSIS — I739 Peripheral vascular disease, unspecified: Secondary | ICD-10-CM

## 2020-12-14 DIAGNOSIS — Z720 Tobacco use: Secondary | ICD-10-CM

## 2020-12-14 DIAGNOSIS — R0602 Shortness of breath: Secondary | ICD-10-CM | POA: Diagnosis not present

## 2020-12-14 DIAGNOSIS — N184 Chronic kidney disease, stage 4 (severe): Secondary | ICD-10-CM | POA: Diagnosis not present

## 2020-12-14 DIAGNOSIS — I129 Hypertensive chronic kidney disease with stage 1 through stage 4 chronic kidney disease, or unspecified chronic kidney disease: Secondary | ICD-10-CM | POA: Diagnosis not present

## 2020-12-14 LAB — URINALYSIS, ROUTINE W REFLEX MICROSCOPIC
Bacteria, UA: NONE SEEN
Bilirubin Urine: NEGATIVE
Glucose, UA: 150 mg/dL — AB
Hgb urine dipstick: NEGATIVE
Ketones, ur: NEGATIVE mg/dL
Leukocytes,Ua: NEGATIVE
Nitrite: NEGATIVE
Protein, ur: >=300 mg/dL — AB
Specific Gravity, Urine: 1.019 (ref 1.005–1.030)
pH: 5 (ref 5.0–8.0)

## 2020-12-14 LAB — HEPATIC FUNCTION PANEL
ALT: 20 U/L (ref 0–44)
AST: 28 U/L (ref 15–41)
Albumin: 2.7 g/dL — ABNORMAL LOW (ref 3.5–5.0)
Alkaline Phosphatase: 56 U/L (ref 38–126)
Bilirubin, Direct: <0.1 mg/dL (ref 0.0–0.2)
Total Bilirubin: 0.5 mg/dL (ref 0.3–1.2)
Total Protein: 5.8 g/dL — ABNORMAL LOW (ref 6.5–8.1)

## 2020-12-14 LAB — GLUCOSE, CAPILLARY
Glucose-Capillary: 191 mg/dL — ABNORMAL HIGH (ref 70–99)
Glucose-Capillary: 120 mg/dL — ABNORMAL HIGH (ref 70–99)
Glucose-Capillary: 133 mg/dL — ABNORMAL HIGH (ref 70–99)

## 2020-12-14 LAB — CBG MONITORING, ED
Glucose-Capillary: 139 mg/dL — ABNORMAL HIGH (ref 70–99)
Glucose-Capillary: 40 mg/dL — CL (ref 70–99)
Glucose-Capillary: 70 mg/dL (ref 70–99)

## 2020-12-14 LAB — SODIUM, URINE, RANDOM: Sodium, Ur: 65 mmol/L

## 2020-12-14 LAB — ECHOCARDIOGRAM COMPLETE
Area-P 1/2: 2.26 cm2
Height: 70 in
S' Lateral: 3.7 cm
Weight: 4112 oz

## 2020-12-14 LAB — HEMOGLOBIN A1C
Hgb A1c MFr Bld: 7.6 % — ABNORMAL HIGH (ref 4.8–5.6)
Mean Plasma Glucose: 171.42 mg/dL

## 2020-12-14 LAB — CREATININE, URINE, RANDOM: Creatinine, Urine: 212.64 mg/dL

## 2020-12-14 LAB — TSH: TSH: 2.039 u[IU]/mL (ref 0.350–4.500)

## 2020-12-14 LAB — HIV ANTIBODY (ROUTINE TESTING W REFLEX): HIV Screen 4th Generation wRfx: NONREACTIVE

## 2020-12-14 MED ORDER — DEXTROSE 50 % IV SOLN
25.0000 mL | Freq: Once | INTRAVENOUS | Status: AC
  Administered 2020-12-14: 25 mL via INTRAVENOUS
  Filled 2020-12-14: qty 50

## 2020-12-14 MED ORDER — LISINOPRIL-HYDROCHLOROTHIAZIDE 20-25 MG PO TABS: 1.0000 | ORAL_TABLET | Freq: Every day | ORAL | Status: DC

## 2020-12-14 MED ORDER — AMLODIPINE BESYLATE 10 MG PO TABS
10.0000 mg | ORAL_TABLET | Freq: Every day | ORAL | Status: DC
  Administered 2020-12-15: 10 mg via ORAL
  Filled 2020-12-14: qty 1

## 2020-12-14 MED ORDER — INSULIN ASPART 100 UNIT/ML IJ SOLN
0.0000 [IU] | Freq: Three times a day (TID) | INTRAMUSCULAR | Status: DC
Start: 2020-12-14 — End: 2020-12-14

## 2020-12-14 MED ORDER — HEPARIN SODIUM (PORCINE) 5000 UNIT/ML IJ SOLN
5000.0000 [IU] | Freq: Three times a day (TID) | INTRAMUSCULAR | Status: DC
  Administered 2020-12-14 – 2020-12-15 (×4): 5000 [IU] via SUBCUTANEOUS
  Filled 2020-12-14 (×4): qty 1

## 2020-12-14 MED ORDER — INSULIN ASPART 100 UNIT/ML IJ SOLN: 0.0000 [IU] | Freq: Three times a day (TID) | INTRAMUSCULAR | Status: DC

## 2020-12-14 MED ORDER — NITROGLYCERIN 0.4 MG SL SUBL: 0.4000 mg | SUBLINGUAL_TABLET | SUBLINGUAL | Status: DC | PRN

## 2020-12-14 MED ORDER — SODIUM CHLORIDE 0.9 % IV SOLN: INTRAVENOUS | Status: DC

## 2020-12-14 MED ORDER — INSULIN GLARGINE-YFGN 100 UNIT/ML ~~LOC~~ SOLN
20.0000 [IU] | Freq: Every day | SUBCUTANEOUS | Status: DC
  Administered 2020-12-14: 20 [IU] via SUBCUTANEOUS
  Filled 2020-12-14 (×2): qty 0.2

## 2020-12-14 MED ORDER — ACETAMINOPHEN 325 MG PO TABS
650.0000 mg | ORAL_TABLET | Freq: Four times a day (QID) | ORAL | Status: DC | PRN
  Administered 2020-12-15: 650 mg via ORAL
  Filled 2020-12-14: qty 2

## 2020-12-14 MED ORDER — TRAZODONE HCL 50 MG PO TABS: 25.0000 mg | ORAL_TABLET | Freq: Every evening | ORAL | Status: DC | PRN

## 2020-12-14 MED ORDER — METFORMIN HCL 500 MG PO TABS: 500.0000 mg | ORAL_TABLET | Freq: Every day | ORAL | Status: DC

## 2020-12-14 MED ORDER — ACETAMINOPHEN 650 MG RE SUPP: 650.0000 mg | Freq: Four times a day (QID) | RECTAL | Status: DC | PRN

## 2020-12-14 MED ORDER — NICOTINE 14 MG/24HR TD PT24: 14.0000 mg | MEDICATED_PATCH | Freq: Every day | TRANSDERMAL | Status: DC | PRN

## 2020-12-14 MED ORDER — HYDRALAZINE HCL 20 MG/ML IJ SOLN: 10.0000 mg | INTRAMUSCULAR | Status: DC | PRN

## 2020-12-14 MED ORDER — OXYCODONE HCL 5 MG PO TABS: 15.0000 mg | ORAL_TABLET | Freq: Four times a day (QID) | ORAL | Status: DC | PRN

## 2020-12-14 MED ORDER — ROSUVASTATIN CALCIUM 20 MG PO TABS
40.0000 mg | ORAL_TABLET | Freq: Every day | ORAL | Status: DC
  Administered 2020-12-15: 40 mg via ORAL
  Filled 2020-12-14: qty 2

## 2020-12-14 MED ORDER — ALUM & MAG HYDROXIDE-SIMETH 200-200-20 MG/5ML PO SUSP: 30.0000 mL | Freq: Four times a day (QID) | ORAL | Status: DC | PRN

## 2020-12-14 MED ORDER — PANTOPRAZOLE SODIUM 40 MG PO TBEC
40.0000 mg | DELAYED_RELEASE_TABLET | Freq: Every day | ORAL | Status: DC
  Administered 2020-12-15: 40 mg via ORAL
  Filled 2020-12-14: qty 1

## 2020-12-14 MED ORDER — ALBUTEROL SULFATE HFA 108 (90 BASE) MCG/ACT IN AERS: 2.0000 | INHALATION_SPRAY | Freq: Four times a day (QID) | RESPIRATORY_TRACT | Status: DC | PRN

## 2020-12-14 MED ORDER — ROSUVASTATIN CALCIUM 20 MG PO TABS
40.0000 mg | ORAL_TABLET | Freq: Every day | ORAL | Status: DC
  Administered 2020-12-14: 40 mg via ORAL
  Filled 2020-12-14: qty 1
  Filled 2020-12-14: qty 2

## 2020-12-14 MED ORDER — CLONIDINE HCL 0.2 MG PO TABS
0.2000 mg | ORAL_TABLET | Freq: Two times a day (BID) | ORAL | Status: DC
  Administered 2020-12-14 – 2020-12-15 (×2): 0.2 mg via ORAL
  Filled 2020-12-14 (×2): qty 1

## 2020-12-14 MED ORDER — EZETIMIBE 10 MG PO TABS
10.0000 mg | ORAL_TABLET | Freq: Every day | ORAL | Status: DC
  Administered 2020-12-15: 10 mg via ORAL
  Filled 2020-12-14: qty 1

## 2020-12-14 MED ORDER — INSULIN ASPART 100 UNIT/ML IJ SOLN
0.0000 [IU] | Freq: Every day | INTRAMUSCULAR | Status: DC
Start: 2020-12-14 — End: 2020-12-14

## 2020-12-14 MED ORDER — EZETIMIBE 10 MG PO TABS
10.0000 mg | ORAL_TABLET | Freq: Every day | ORAL | Status: DC
  Administered 2020-12-14: 10 mg via ORAL
  Filled 2020-12-14 (×2): qty 1

## 2020-12-14 MED ORDER — ONDANSETRON HCL 4 MG/2ML IJ SOLN: 4.0000 mg | Freq: Four times a day (QID) | INTRAMUSCULAR | Status: DC | PRN

## 2020-12-14 MED ORDER — ONDANSETRON HCL 4 MG PO TABS
4.0000 mg | ORAL_TABLET | Freq: Four times a day (QID) | ORAL | Status: DC | PRN
  Filled 2020-12-14: qty 1

## 2020-12-14 MED ORDER — PANTOPRAZOLE SODIUM 40 MG PO TBEC
40.0000 mg | DELAYED_RELEASE_TABLET | Freq: Every day | ORAL | Status: DC
  Administered 2020-12-14: 40 mg via ORAL
  Filled 2020-12-14: qty 1

## 2020-12-14 MED ORDER — ACETAMINOPHEN 325 MG PO TABS: 650.0000 mg | ORAL_TABLET | ORAL | Status: DC | PRN

## 2020-12-14 MED ORDER — ALBUTEROL SULFATE (2.5 MG/3ML) 0.083% IN NEBU: 2.5000 mg | INHALATION_SOLUTION | Freq: Four times a day (QID) | RESPIRATORY_TRACT | Status: DC | PRN

## 2020-12-14 NOTE — Progress Notes (Addendum)
Renal ultrasound noted no acute abnormalities and medical renal disease.  FeNa 0.6% to suggest prerenal cause of symptoms.  Started normal saline IV fluids at 75 mL/h overnight.  Recheck kidney function in a.m.  Aware that patient's symptoms could be related to decreased heart function.  Provider should discontinue IV fluids if patient noted to have worsening respiratory status.  Appreciate diabetic education.  Given possible Myoview stress test tomorrow morning decreased Semglee recommendation of 35 units nightly to 20units to hopefully avoid any hypoglycemic episodes in case procedure not able to be done until afternoon.

## 2020-12-14 NOTE — Consult Note (Signed)
Cardiology Consultation:   Patient ID: Boss Danielsen MRN: [id number]; DOB: 1955-05-10  Admit date: 12/13/2020 Date of Consult: 12/14/2020  PCP:  Secundino Ginger, [name]   [facility] Providers Cardiologist:  New - remotely seen by Dr. Gwenlyn Found in 2014 for PAD, underwent L SFA atherectomy and lost to follow up since Jan 2015    Patient Profile:   Kshawn Canal is a 65 y.o. male with a hx of HTN, HLD, DM II, tobacco abuse, CKD and PAD s/p LE stent who is being seen 12/14/2020 for the evaluation of chest pain at the request of Dr. Tamala Julian.  History of Present Illness:   Mr. Haak is a 65 year old male with past medical history of HTN, HLD, DM II, tobacco abuse, CKD and PAD s/p LE stent.  Patient had a negative Myoview on 11/18/2011.  He has been smoking since age 52 and quit about 1 week ago.  He smoked less than 1 pack/day.  He was initially referred to Dr. Gwenlyn Found for evaluation of lower extremity peripheral arterial disease.  He underwent stenting of left SFA on 11/22/2011.  He was last seen by Dr. Gwenlyn Found on 01/30/2013.  He underwent lower extremity arterial cardiography on 02/28/2013 which revealed 90% in-stent restenosis in the left SFA stent which was treated with directional atherectomy using turbohawk.  Patient had 50% segmental mid right SFA stenosis with three-vessel runoff.  He has been lost to follow-up since.  He was in his usual state of health until the morning of 12/10/2020 when he started having left-sided focal chest discomfort shortly after waking up.  The symptom occurred while at rest.  He denies any exacerbating factors such as deep inspiration, body rotation or palpation.  Total duration of the chest pain lasted about 45 minutes.  Serial troponin was negative x2 in the ER, however lab work at the time showed his creatinine has went up to 2.49 compared to 1.29 from January 2015.  His blood pressure was also elevated in the 180s.  He was discharged with addition of Imdur with  instruction to follow-up with cardiology service as outpatient.  Patient did fine for the next 2 days, however returned back to the hospital on 12/13/2020 after having another episode of chest discomfort.  By the time he arrived in the ED, chest pain has resolved.  Creatinine was 2.7.  Troponin 50-->50 which was borderline elevated when compared to 4 days ago.  Hemoglobin 11.0.  Chest x-ray normal.  EKG showed no acute changes.  Urinalysis shows greater than 300 protein.  Renal ultrasound shows no hydronephrosis, normal left kidney, however increased echogenicity in the right kidney suggestive of chronic medical renal disease.  Echocardiogram is currently pending.  Cardiology service has been consulted for chest pain.   Past Medical History:  Diagnosis Date   Arthritis    Depression    Diabetes mellitus    insulin dependent   Fibromyalgia    GERD (gastroesophageal reflux disease)    Hyperlipidemia    Hypertension    Peripheral vascular disease ([place])     Past Surgical History:  Procedure Laterality Date   ATHERECTOMY  02/28/2013   STENT    TO SFA   FEMORAL ARTERY STENT     LOWER EXTREMITY ANGIOGRAM N/A 11/22/2011   Procedure: LOWER EXTREMITY ANGIOGRAM;  Surgeon: Lorretta Harp, MD;  Location: [facility] CATH LAB;  Service: Cardiovascular;  Laterality: N/A;   LOWER EXTREMITY ANGIOGRAM N/A 02/28/2013   Procedure: LOWER EXTREMITY ANGIOGRAM;  Surgeon: Lorretta Harp, MD;  Location: [facility] CATH LAB;  Service: Cardiovascular;  Laterality: N/A;     Home Medications:  Prior to Admission medications   Medication Sig Start Date End Date Taking? Authorizing Provider  acetaminophen (TYLENOL) 500 MG tablet Take 500 mg by mouth every 6 (six) hours as needed for mild pain, fever or headache.   Yes [provider]  albuterol (PROVENTIL HFA;VENTOLIN HFA) 108 (90 BASE) MCG/ACT inhaler Inhale 2 puffs into the lungs every 6 (six) hours as needed for shortness of breath.   Yes [provider]   amLODipine (NORVASC) 10 MG tablet Take 10 mg by mouth daily. 11/19/20  Yes [provider]  aspirin 81 MG chewable tablet Chew 81 mg by mouth daily.   Yes [provider]  baclofen (LIORESAL) 10 MG tablet Take 10 mg by mouth 2 (two) times daily as needed for muscle spasms.   Yes [provider]  cloNIDine (CATAPRES) 0.2 MG tablet Take 0.2 mg by mouth 2 (two) times daily. 10/23/20  Yes [provider]  ezetimibe (ZETIA) 10 MG tablet Take 10 mg by mouth daily.   Yes [provider]  Insulin Glargine (BASAGLAR KWIKPEN) 100 UNIT/ML Inject 70 Units into the skin every evening. 11/24/20  Yes [provider]  isosorbide mononitrate (IMDUR) 30 MG 24 hr tablet Take 1 tablet (30 mg total) by mouth daily. 12/10/20 01/09/21 Yes Godfrey Pick, MD  JANUVIA 50 MG tablet Take 50 mg by mouth daily. 10/20/20  Yes [provider]  omeprazole (PRILOSEC) 40 MG capsule Take 40 mg by mouth daily. 10/19/20  Yes [provider]  oxyCODONE (ROXICODONE) 15 MG immediate release tablet Take 15 mg by mouth 4 (four) times daily as needed for pain. 11/23/20  Yes [provider]  OZEMPIC, 1 MG/DOSE, 4 MG/3ML SOPN Inject 1 mg into the skin once a week. 11/19/20  Yes [provider]  rosuvastatin (CRESTOR) 40 MG tablet Take 40 mg by mouth daily.   Yes [provider]  sildenafil (REVATIO) 20 MG tablet Take 20-100 mg by mouth daily as needed for erectile dysfunction. 11/26/20  Yes [provider]  Vitamin D, Ergocalciferol, (DRISDOL) 1.25 MG (50000 UNIT) CAPS capsule Take 50,000 Units by mouth once a week. 11/19/20  Yes [provider]  ACCU-CHEK FASTCLIX LANCETS MISC 1 kit by Does not apply route 2 (two) times daily.    [provider]  clopidogrel (PLAVIX) 75 MG tablet TAKE ONE TABLET BY MOUTH DAILYMUST MAKE APPOINTMENT FOR FURTHER REFILLS Patient not taking: Reported on 12/14/2020 04/28/14   Lorretta Harp, MD   glucose blood test strip 1 each by Other route as needed for other. Use as instructed    [provider]  lisinopril-hydrochlorothiazide (PRINZIDE,ZESTORETIC) 20-25 MG per tablet Take 1 tablet by mouth daily. Patient not taking: Reported on 12/14/2020    [provider]  metFORMIN (GLUCOPHAGE) 500 MG tablet Take 500 mg by mouth daily with breakfast.  Patient not taking: Reported on 12/14/2020 01/14/13   [provider]  NOVOFINE 32G X 6 MM MISC  12/14/12   [provider]  omeprazole (PRILOSEC) 20 MG capsule Take 20 mg by mouth daily. Patient not taking: Reported on 12/14/2020    [provider]    Inpatient Medications: Scheduled Meds:  [START ON 12/15/2020] amLODipine  10 mg Oral Daily   aspirin  81 mg Oral Daily   cloNIDine  0.2 mg Oral BID   [START ON 12/15/2020] ezetimibe  10 mg Oral Daily  heparin  5,000 Units Subcutaneous Q8H   [START ON 12/15/2020] pantoprazole  40 mg Oral Daily   [START ON 12/15/2020] rosuvastatin  40 mg Oral Daily   Continuous Infusions:  PRN Meds: acetaminophen **OR** acetaminophen, albuterol, alum & mag hydroxide-simeth, hydrALAZINE, nicotine, ondansetron **OR** ondansetron (ZOFRAN) IV, oxyCODONE, traZODone  Allergies:   No Known Allergies  Social History:   Social History   Socioeconomic History   Marital status: Single    Spouse name: Not on file   Number of children: Not on file   Years of education: Not on file   Highest education level: Not on file  Occupational History   Not on file  Tobacco Use   Smoking status: Every Day    Packs/day: 0.50    Years: 40.00    Pack years: 20.00    Types: Cigarettes   Smokeless tobacco: Never  Vaping Use   Vaping Use: Never used  Substance and Sexual Activity   Alcohol use: No   Drug use: No   Sexual activity: Not on file  Other Topics Concern   Not on file  Social History Narrative   Not on file   Social Determinants of Health   Financial  Resource Strain: Not on file  Food Insecurity: Not on file  Transportation Needs: Not on file  Physical Activity: Not on file  Stress: Not on file  Social Connections: Not on file  Intimate Partner Violence: Not on file    Family History:    Family History  Problem Relation Age of Onset   Heart attack Mother 53     ROS:  Please see the history of present illness.   All other ROS reviewed and negative.     Physical Exam/Data:   Vitals:   12/14/20 0430 12/14/20 0730 12/14/20 0927 12/14/20 1340  BP: 117/90 (!) 117/99 (!) 160/79 (!) 165/63  Pulse: (!) 59 73 71 67  Resp: 14 19 (!) 21 18  Temp:   98 F (36.7 C) 97.8 F (36.6 C)  TempSrc:   Oral Oral  SpO2: 100% 100% 100%   Weight:      Height:       No intake or output data in the 24 hours ending 12/14/20 1355 Last 3 Weights 12/13/2020 12/10/2020 03/01/2013  Weight (lbs) 257 lb 255 lb 246 lb 0.5 oz  Weight (kg) 116.574 kg 115.667 kg 111.6 kg     Body mass index is 36.88 kg/m.  General:  Well nourished, well developed, in no acute distress HEENT: normal Neck: no JVD Vascular: No carotid bruits; Distal pulses 2+ bilaterally Cardiac:  normal S1, S2; RRR; no murmur  Lungs:  clear to auscultation bilaterally, no wheezing, rhonchi or rales  Abd: soft, nontender, no hepatomegaly  Ext: no edema Musculoskeletal:  No deformities, BUE and BLE strength normal and equal Skin: warm and dry  Neuro:  CNs 2-12 intact, no focal abnormalities noted Psych:  Normal affect   EKG:  The EKG was personally reviewed and demonstrates:  NSR without significant ST-T wave changes Telemetry:  Telemetry was personally reviewed and demonstrates:  NSR without significant ventricular ectopy  Relevant CV Studies:  Pending echocardiogram today  Laboratory Data:  High Sensitivity Troponin:   Recent Labs  Lab 12/10/20 1140 12/10/20 1359 12/13/20 1457 12/13/20 1715  TROPONINIHS 14 17 50* 50*     Chemistry Recent Labs  Lab 12/10/20 1140  12/13/20 1456  NA 140 138  K 3.7 4.3  CL 106 105  CO2 25  26  GLUCOSE 118* 94  BUN 23 21  CREATININE 2.49* 2.70*  CALCIUM 9.0 8.9  GFRNONAA 28* 25*  ANIONGAP 9 7    Recent Labs  Lab 12/14/20 1130  PROT 5.8*  ALBUMIN 2.7*  AST 28  ALT 20  ALKPHOS 56  BILITOT 0.5   Lipids No results for input(s): CHOL, TRIG, HDL, LABVLDL, LDLCALC, CHOLHDL in the last 168 hours.  Hematology Recent Labs  Lab 12/10/20 1140 12/13/20 1456  WBC 12.3* 9.4  RBC 4.41 4.20*  HGB 11.5* 11.0*  HCT 36.4* 34.5*  MCV 82.5 82.1  MCH 26.1 26.2  MCHC 31.6 31.9  RDW 14.4 14.1  PLT 244 223   Thyroid  Recent Labs  Lab 12/14/20 0951  TSH 2.039    BNP Recent Labs  Lab 12/13/20 1456  BNP 34.2    DDimer No results for input(s): DDIMER in the last 168 hours.   Radiology/Studies:  DG Chest 2 View  Result Date: 12/13/2020 CLINICAL DATA:  Chest pain EXAM: CHEST - 2 VIEW COMPARISON:  Chest radiograph dated 12/10/2020 FINDINGS: The heart size and mediastinal contours are within normal limits. Both lungs are clear. The visualized skeletal structures are unremarkable. IMPRESSION: No active cardiopulmonary disease. Electronically Signed   By: Zerita Boers M.D.   On: 12/13/2020 17:15   US RENAL  Result Date: 12/14/2020 CLINICAL DATA:  Acute kidney injury. EXAM: RENAL / URINARY TRACT ULTRASOUND COMPLETE COMPARISON:  CT AP 08/10/2017 FINDINGS: Right Kidney: Renal measurements: 10.9 x 4.7 x 6.5 cm = volume: 173.4 mL. Increased parenchymal echogenicity. No hydronephrosis. Small cyst within the inferior pole measures 0.8 x 0.7 x 0.9 cm. Left Kidney: Renal measurements: 11.1 x 6.4 x 5.8 cm = volume: 214.2 mL. Echogenicity within normal limits. No mass or hydronephrosis visualized. Bladder: Is partially decompressed.  Left ureteral jet not visualized. Other: Prostate gland measures 3.1 x 2.8 x 4.3 cm (volume = 20 cm^3). IMPRESSION: 1. No hydronephrosis. 2. Increased echogenicity of the right kidney suggesting  chronic medical renal disease. Electronically Signed   By: Kerby Moors M.D.   On: 12/14/2020 13:28   US Venous Img Lower Bilateral (DVT)  Result Date: 12/13/2020 CLINICAL DATA:  Leg swelling.  Chest pain. EXAM: BILATERAL LOWER EXTREMITY VENOUS DOPPLER ULTRASOUND TECHNIQUE: Gray-scale sonography with graded compression, as well as color Doppler and duplex ultrasound were performed to evaluate the lower extremity deep venous systems from the level of the common femoral vein and including the common femoral, femoral, profunda femoral, popliteal and calf veins including the posterior tibial, peroneal and gastrocnemius veins when visible. The superficial great saphenous vein was also interrogated. Spectral Doppler was utilized to evaluate flow at rest and with distal augmentation maneuvers in the common femoral, femoral and popliteal veins. COMPARISON:  None. FINDINGS: RIGHT LOWER EXTREMITY Common Femoral Vein: No evidence of thrombus. Normal compressibility, respiratory phasicity and response to augmentation. Saphenofemoral Junction: No evidence of thrombus. Normal compressibility and flow on color Doppler imaging. Profunda Femoral Vein: No evidence of thrombus. Normal compressibility and flow on color Doppler imaging. Femoral Vein: No evidence of thrombus. Normal compressibility, respiratory phasicity and response to augmentation. Popliteal Vein: No evidence of thrombus. Normal compressibility, respiratory phasicity and response to augmentation. Calf Veins: No evidence of thrombus. Normal compressibility and flow on color Doppler imaging. Superficial Great Saphenous Vein: No evidence of thrombus. Normal compressibility. Venous Reflux:  None. Other Findings:  None. LEFT LOWER EXTREMITY Common Femoral Vein: No evidence of thrombus. Normal compressibility, respiratory phasicity and response to  augmentation. Saphenofemoral Junction: No evidence of thrombus. Normal compressibility and flow on color Doppler imaging.  Profunda Femoral Vein: No evidence of thrombus. Normal compressibility and flow on color Doppler imaging. Femoral Vein: No evidence of thrombus. Normal compressibility, respiratory phasicity and response to augmentation. Popliteal Vein: No evidence of thrombus. Normal compressibility, respiratory phasicity and response to augmentation. Calf Veins: No evidence of thrombus. Normal compressibility and flow on color Doppler imaging. Superficial Great Saphenous Vein: No evidence of thrombus. Normal compressibility. Venous Reflux:  None. Other Findings:  None. IMPRESSION: No evidence of deep venous thrombosis in either lower extremity. Electronically Signed   By: Iven Finn M.D.   On: 12/13/2020 16:59     Assessment and Plan:   Chest pain  -Patient had a total of 2 episodes of chest pain in 4 days.  Both episodes lasted around 45 minutes.  -After the first episode, patient sought medical attention in the emergency room and serial troponin was negative.  After the second episode of chest pain, serial troponin was borderline elevated at 50-->50, creatinine 2.7.  -Both episode of chest pains occurred at rest with no associated symptoms.  -Given renal dysfunction, will discuss with MD regarding Myoview tomorrow morning.  Acute versus chronic kidney disease: Consider soft fluid hydration overnight to see if there is significant improvement in the renal function.  Renal ultrasound today showed likely right chronic medical renal disease, normal left kidney, no hydronephrosis.  -Agree with holding lisinopril-hydrochlorothiazide.  Hypertension: Blood pressure elevated.  Continue home amlodipine and clonidine.  Imdur was added to his medical regimen 4 days ago however was not ordered for this admission.  Home lisinopril-hydrochlorothiazide has been held in light of kidney disease.  Hyperlipidemia: On Crestor  DM2  Tobacco abuse: Smoked less than 1 pack/day since age 64.  He states he quit about a week  ago.  PAD: Underwent left SFA stent in October 2013 and later underwent arthrectomy of left SFA in-stent restenosis in January 2015.  50% right SFA disease residual noted at the time last intervention.   Risk Assessment/Risk Scores:     HEAR Score (for undifferentiated chest pain):  HEAR Score: 6          For questions or updates, please contact [place] Please consult [email or web address] for contact info under    Signed, Almyra Deforest, [place]  12/14/2020 1:55 PM

## 2020-12-14 NOTE — Progress Notes (Addendum)
Patient transferred to [place] about 0915. He is A and O X 4. No c/o pain at this time, all his vitals are with with in normal limits. TELE monitor placed- notified. Patient oriented to his room, how to use call bell, TV guide etc. Call bell with in reach, bed is in lowest position. Patient have personal medication at bedside, pt made aware of to do not take them while he is here or send back home. He acknowledged, he will not take them.

## 2020-12-14 NOTE — Plan of Care (Signed)

## 2020-12-14 NOTE — H&P (Addendum)
History and Physical    Robert Rocha WUJ:[id number] DOB: November 23, 1955 DOA: 12/13/2020  Referring MD/NP/PA: Mitzi Hansen, MD PCP: Secundino Ginger, [name]  [facility]  Chief Complaint: Chest pain  I have personally briefly reviewed patient's old medical records in [place]   HPI: Robert Rocha is a 65 y.o. male with medical history significant of hypertension, diabetes, hyperlipidemia, peripheral vascular disease s/p stent, and tobacco abuse who presents with complaints of intermittent chest pain.  Symptoms started initially 4 days ago after he woke up.  Patient reported having substernal chest pain that did not radiate, but prior to symptoms patient reported that he had woken up with low blood sugars.  Patient drank something with improvement in blood sugars. Due to his symptoms he was evaluated at the emergency department.  At that time work up for chest pain was unremarkable including high-sensitivity troponins negative x2.  He had been discharged home on isosorbide mononitrate 30 mg daily and recommended follow-up with his cardiologist.  He reported that he was doing okay until yesterday morning.  After he woke up he reported having an episode of diaphoresis that went away on its own.  After he sat down at the kitchen table and was drinking some coffee he had acute onset of substernal chest pain that he describes as feeling like an elephant was sitting on his chest.  Noted associated symptoms of feeling his heart beating fast and has been having lower extremity leg swelling.  Denies having shortness of breath, nausea, vomiting, diarrhea, lightheadedness, shortness of breath, blood in stool(Cologuard negative in 2021), or dysuria.  His family history is significant for his mother having heart attack at the age of 66/59.  He previously had stress testing done back in 2013 that was noted to be within normal by Dr. Gwenlyn Found.  Patient notes that he is on 70 units of  long-acting insulin at night, Januvia 50 mg daily, and Ozempic.  He did not take his long-acting insulin last night as his blood sugars seem to be a little low.  To his knowledge he has also never been told that he had any issues with his kidney function.  Patient admits to smoking half pack cigarettes per day on average and has done so approximately 41 years.  He states that he has quit smoking since couple days ago.  Lastly patient does report intermittently having left lower quadrant pain from time to time it goes away on its own.  Due to him having recurrent symptoms he asked his friend to take him back to [place] for further evaluation.  ED Course: Upon admission into the emergency department patient was seen to be afebrile, pulse 56-73, blood pressures 117/90 183/68, and O2 saturation maintained on room air.  Labs significant for hemoglobin 11, BUN 21, creatinine 2.7, BNP 34.2, and high-sensitivity troponin 50->50.  Chest x-ray showed no acute abnormalities.  Influenza and COVID-19 screening was negative.  Doppler ultrasound of lower extremities negative.  Prior to transport patient had a drop in blood sugar into the 50s for which she was given an amp of D 50.  Review of Systems  Constitutional:  Negative for fever and malaise/fatigue.  HENT:  Negative for congestion and nosebleeds.   Eyes:  Negative for photophobia and pain.  Respiratory:  Negative for cough and shortness of breath.   Cardiovascular:  Positive for chest pain and leg swelling.  Gastrointestinal:  Positive for abdominal pain. Negative for diarrhea, nausea and vomiting.  Genitourinary:  Negative for dysuria and hematuria.  Musculoskeletal:  Positive for back pain.  Skin:  Negative for itching and rash.  Neurological:  Negative for focal weakness and loss of consciousness.  Psychiatric/Behavioral:  Positive for substance abuse. Negative for memory loss.    Past Medical History:  Diagnosis Date   Arthritis    Depression     Diabetes mellitus    insulin dependent   Fibromyalgia    GERD (gastroesophageal reflux disease)    Hyperlipidemia    Hypertension    Peripheral vascular disease ([place])     Past Surgical History:  Procedure Laterality Date   ATHERECTOMY  02/28/2013   STENT    TO SFA   FEMORAL ARTERY STENT     LOWER EXTREMITY ANGIOGRAM N/A 11/22/2011   Procedure: LOWER EXTREMITY ANGIOGRAM;  Surgeon: Lorretta Harp, MD;  Location: [facility] CATH LAB;  Service: Cardiovascular;  Laterality: N/A;   LOWER EXTREMITY ANGIOGRAM N/A 02/28/2013   Procedure: LOWER EXTREMITY ANGIOGRAM;  Surgeon: Lorretta Harp, MD;  Location: [facility] CATH LAB;  Service: Cardiovascular;  Laterality: N/A;     reports that he has been smoking cigarettes. He has a 20.00 pack-year smoking history. He has never used smokeless tobacco. He reports that he does not drink alcohol and does not use drugs.  No Known Allergies  Family History  Problem Relation Age of Onset   Heart attack Mother 50    No current facility-administered medications on file prior to encounter.   Current Outpatient Medications on File Prior to Encounter  Medication Sig Dispense Refill   acetaminophen (TYLENOL) 500 MG tablet Take 500 mg by mouth every 6 (six) hours as needed for mild pain, fever or headache.     albuterol (PROVENTIL HFA;VENTOLIN HFA) 108 (90 BASE) MCG/ACT inhaler Inhale 2 puffs into the lungs every 6 (six) hours as needed for shortness of breath.     amLODipine (NORVASC) 10 MG tablet Take 10 mg by mouth daily.     aspirin 81 MG chewable tablet Chew 81 mg by mouth daily.     baclofen (LIORESAL) 10 MG tablet Take 10 mg by mouth 2 (two) times daily as needed for muscle spasms.     cloNIDine (CATAPRES) 0.2 MG tablet Take 0.2 mg by mouth 2 (two) times daily.     ezetimibe (ZETIA) 10 MG tablet Take 10 mg by mouth daily.     Insulin Glargine (BASAGLAR KWIKPEN) 100 UNIT/ML Inject 70 Units into the skin every evening.     isosorbide mononitrate (IMDUR) 30 MG 24 hr  tablet Take 1 tablet (30 mg total) by mouth daily. 30 tablet 0   JANUVIA 50 MG tablet Take 50 mg by mouth daily.     omeprazole (PRILOSEC) 40 MG capsule Take 40 mg by mouth daily.     oxyCODONE (ROXICODONE) 15 MG immediate release tablet Take 15 mg by mouth 4 (four) times daily as needed for pain.     OZEMPIC, 1 MG/DOSE, 4 MG/3ML SOPN Inject 1 mg into the skin once a week.     rosuvastatin (CRESTOR) 40 MG tablet Take 40 mg by mouth daily.     sildenafil (REVATIO) 20 MG tablet Take 20-100 mg by mouth daily as needed for erectile dysfunction.     Vitamin D, Ergocalciferol, (DRISDOL) 1.25 MG (50000 UNIT) CAPS capsule Take 50,000 Units by mouth once a week.     ACCU-CHEK FASTCLIX LANCETS MISC 1 kit by Does not apply route 2 (two) times daily.  clopidogrel (PLAVIX) 75 MG tablet TAKE ONE TABLET BY MOUTH DAILYMUST MAKE APPOINTMENT FOR FURTHER REFILLS (Patient not taking: Reported on 12/14/2020) 20 tablet 0   glucose blood test strip 1 each by Other route as needed for other. Use as instructed     lisinopril-hydrochlorothiazide (PRINZIDE,ZESTORETIC) 20-25 MG per tablet Take 1 tablet by mouth daily. (Patient not taking: Reported on 12/14/2020)     metFORMIN (GLUCOPHAGE) 500 MG tablet Take 500 mg by mouth daily with breakfast.  (Patient not taking: Reported on 12/14/2020)     NOVOFINE 32G X 6 MM MISC      omeprazole (PRILOSEC) 20 MG capsule Take 20 mg by mouth daily. (Patient not taking: Reported on 12/14/2020)        Physical Exam:  Constitutional: Elderly male who appears to be in no acute distress at this time Vitals:   12/14/20 0330 12/14/20 0400 12/14/20 0430 12/14/20 0730  BP: 133/64 135/62 117/90 (!) 117/99  Pulse: 62 (!) 57 (!) 59 73  Resp: _0 Temp:      TempSrc:      SpO2: 98% 98% 100% 100%  Weight:      Height:       Eyes: PERRL, lids and conjunctivae normal ENMT: Mucous membranes are moist. Posterior pharynx clear of any exudate or lesions.   Neck: normal, supple, no  masses, no thyromegaly Respiratory: clear to auscultation bilaterally, no wheezing, no crackles. Normal respiratory effort. No accessory muscle use.  Cardiovascular: Bradycardic, no murmurs / rubs / gallops.  2+ pitting lower extremity edema . 2+ pedal pulses. No carotid bruits.  Abdomen: no tenderness, no masses palpated. No hepatosplenomegaly. Bowel sounds positive.  Musculoskeletal: no clubbing / cyanosis. No joint deformity upper and lower extremities. Good ROM, no contractures. Normal muscle tone.  Skin: no rashes, lesions, ulcers. No induration Neurologic: CN 2-12 grossly intact. Sensation intact, DTR normal. Strength 5/5 in all 4.  Psychiatric: Normal judgment and insight. Alert and oriented x 3. Normal mood.     Labs on Admission: I have personally reviewed following labs and imaging studies  CBC: Recent Labs  Lab 12/10/20 1140 12/13/20 1456  WBC 12.3* 9.4  HGB 11.5* 11.0*  HCT 36.4* 34.5*  MCV 82.5 82.1  PLT 244 867   Basic Metabolic Panel: Recent Labs  Lab 12/10/20 1140 12/13/20 1456  NA 140 138  K 3.7 4.3  CL 106 105  CO2 25 26  GLUCOSE 118* 94  BUN 23 21  CREATININE 2.49* 2.70*  CALCIUM 9.0 8.9   GFR: Estimated Creatinine Clearance: 34.4 mL/min (A) (by C-G formula based on SCr of 2.7 mg/dL (H)). Liver Function Tests: No results for input(s): AST, ALT, ALKPHOS, BILITOT, PROT, ALBUMIN in the last 168 hours. No results for input(s): LIPASE, AMYLASE in the last 168 hours. No results for input(s): AMMONIA in the last 168 hours. Coagulation Profile: No results for input(s): INR, PROTIME in the last 168 hours. Cardiac Enzymes: No results for input(s): CKTOTAL, CKMB, CKMBINDEX, TROPONINI in the last 168 hours. BNP (last 3 results) No results for input(s): PROBNP in the last 8760 hours. HbA1C: No results for input(s): HGBA1C in the last 72 hours. CBG: Recent Labs  Lab 12/14/20 0014 12/14/20 0651 12/14/20 0724  GLUCAP 70 40* 139*   Lipid Profile: No  results for input(s): CHOL, HDL, LDLCALC, TRIG, CHOLHDL, LDLDIRECT in the last 72 hours. Thyroid Function Tests: No results for input(s): TSH, T4TOTAL, FREET4, T3FREE, THYROIDAB in the last 72 hours. Anemia Panel:  No results for input(s): VITAMINB12, FOLATE, FERRITIN, TIBC, IRON, RETICCTPCT in the last 72 hours. Urine analysis: No results found for: COLORURINE, APPEARANCEUR, LABSPEC, PHURINE, GLUCOSEU, HGBUR, BILIRUBINUR, KETONESUR, PROTEINUR, UROBILINOGEN, NITRITE, LEUKOCYTESUR Sepsis Labs: Recent Results (from the past 240 hour(s))  Resp Panel by RT-PCR (Flu A&B, Covid) Nasopharyngeal Swab     Status: None   Collection Time: 12/13/20  4:38 PM   Specimen: Nasopharyngeal Swab; Nasopharyngeal(NP) swabs in vial transport medium  Result Value Ref Range Status   SARS Coronavirus 2 by RT PCR NEGATIVE NEGATIVE Final    Comment: (NOTE) SARS-CoV-2 target nucleic acids are NOT DETECTED.  The SARS-CoV-2 RNA is generally detectable in upper respiratory specimens during the acute phase of infection. The lowest concentration of SARS-CoV-2 viral copies this assay can detect is 138 copies/mL. A negative result does not preclude SARS-Cov-2 infection and should not be used as the sole basis for treatment or other patient management decisions. A negative result may occur with  improper specimen collection/handling, submission of specimen other than nasopharyngeal swab, presence of viral mutation(s) within the areas targeted by this assay, and inadequate number of viral copies(<138 copies/mL). A negative result must be combined with clinical observations, patient history, and epidemiological information. The expected result is Negative.  Fact Sheet for Patients:  EntrepreneurPulse.com.au  Fact Sheet for Healthcare Providers:  IncredibleEmployment.be  This test is no t yet approved or cleared by the [place] FDA and  has been authorized for detection and/or  diagnosis of SARS-CoV-2 by FDA under an Emergency Use Authorization (EUA). This EUA will remain  in effect (meaning this test can be used) for the duration of the COVID-19 declaration under Section 564(b)(1) of the Act, 21 U.S.C.section 360bbb-3(b)(1), unless the authorization is terminated  or revoked sooner.       Influenza A by PCR NEGATIVE NEGATIVE Final   Influenza B by PCR NEGATIVE NEGATIVE Final    Comment: (NOTE) The Xpert Xpress SARS-CoV-2/FLU/RSV plus assay is intended as an aid in the diagnosis of influenza from Nasopharyngeal swab specimens and should not be used as a sole basis for treatment. Nasal washings and aspirates are unacceptable for Xpert Xpress SARS-CoV-2/FLU/RSV testing.  Fact Sheet for Patients: EntrepreneurPulse.com.au  Fact Sheet for Healthcare Providers: IncredibleEmployment.be  This test is not yet approved or cleared by the [place] FDA and has been authorized for detection and/or diagnosis of SARS-CoV-2 by FDA under an Emergency Use Authorization (EUA). This EUA will remain in effect (meaning this test can be used) for the duration of the COVID-19 declaration under Section 564(b)(1) of the Act, 21 U.S.C. section 360bbb-3(b)(1), unless the authorization is terminated or revoked.  Performed at [facility], [address] [place], [place] [address]      Radiological Exams on Admission: DG Chest 2 View  Result Date: 12/13/2020 CLINICAL DATA:  Chest pain EXAM: CHEST - 2 VIEW COMPARISON:  Chest radiograph dated 12/10/2020 FINDINGS: The heart size and mediastinal contours are within normal limits. Both lungs are clear. The visualized skeletal structures are unremarkable. IMPRESSION: No active cardiopulmonary disease. Electronically Signed   By: Zerita Boers M.D.   On: 12/13/2020 17:15   US Venous Img Lower Bilateral (DVT)  Result Date: 12/13/2020 CLINICAL DATA:  Leg swelling.  Chest pain. EXAM:  BILATERAL LOWER EXTREMITY VENOUS DOPPLER ULTRASOUND TECHNIQUE: Gray-scale sonography with graded compression, as well as color Doppler and duplex ultrasound were performed to evaluate the lower extremity deep venous systems from the level of the common femoral  vein and including the common femoral, femoral, profunda femoral, popliteal and calf veins including the posterior tibial, peroneal and gastrocnemius veins when visible. The superficial great saphenous vein was also interrogated. Spectral Doppler was utilized to evaluate flow at rest and with distal augmentation maneuvers in the common femoral, femoral and popliteal veins. COMPARISON:  None. FINDINGS: RIGHT LOWER EXTREMITY Common Femoral Vein: No evidence of thrombus. Normal compressibility, respiratory phasicity and response to augmentation. Saphenofemoral Junction: No evidence of thrombus. Normal compressibility and flow on color Doppler imaging. Profunda Femoral Vein: No evidence of thrombus. Normal compressibility and flow on color Doppler imaging. Femoral Vein: No evidence of thrombus. Normal compressibility, respiratory phasicity and response to augmentation. Popliteal Vein: No evidence of thrombus. Normal compressibility, respiratory phasicity and response to augmentation. Calf Veins: No evidence of thrombus. Normal compressibility and flow on color Doppler imaging. Superficial Great Saphenous Vein: No evidence of thrombus. Normal compressibility. Venous Reflux:  None. Other Findings:  None. LEFT LOWER EXTREMITY Common Femoral Vein: No evidence of thrombus. Normal compressibility, respiratory phasicity and response to augmentation. Saphenofemoral Junction: No evidence of thrombus. Normal compressibility and flow on color Doppler imaging. Profunda Femoral Vein: No evidence of thrombus. Normal compressibility and flow on color Doppler imaging. Femoral Vein: No evidence of thrombus. Normal compressibility, respiratory phasicity and response to  augmentation. Popliteal Vein: No evidence of thrombus. Normal compressibility, respiratory phasicity and response to augmentation. Calf Veins: No evidence of thrombus. Normal compressibility and flow on color Doppler imaging. Superficial Great Saphenous Vein: No evidence of thrombus. Normal compressibility. Venous Reflux:  None. Other Findings:  None. IMPRESSION: No evidence of deep venous thrombosis in either lower extremity. Electronically Signed   By: Iven Finn M.D.   On: 12/13/2020 16:59    EKG: Independently reviewed.  Sinus rhythm at 60 bpm with minimal ST depression in the inferior leads similar to previous  Assessment/Plan Chest pain: Acute.  Patient presents with complaints of intermittent chest pain.  Initially seen on 10/20 in the ED and started on isosorbide mononitrate.  At bedtime tropes at that time were negative, but noted to be positive on return on 10/24 at 50->50.  EKG noted sinus rhythm with minimal ST depressions noted in inferior leads previous work-up included a stress test back in 2013 with normal results. -Admit to a cardiac telemetry bed -Check lipid panel and hemoglobin A1c -Check echocardiogram  -Continue aspirin and statin -Cardiology consulted, we will follow-up for further recommendations  Hypertensive urgency/emergency: Acute.  Blood pressures initially were noted to be elevated at 183/68.  Home blood pressure medications include clonidine 0.2 mg twice daily, amlodipine 10 mg daily, and isosorbide 30 mg daily (started on 10/20).  Patient had taken his morning medications including isosorbide mononitrate -Continue amlodipine and clonidine -Hold nephrotoxic agents -Continue hydralazine IV as needed for elevated blood pressure  Acute kidney injury superimposed on chronic kidney disease stage IIIa: Patient presents with creatinine elevated up to 2.7 with BUN 21.  Previous records note creatinine was 2.49 on 10/20 and before that 1.6 in 02/2019. -Check renal  ultrasound -Check urinalysis -Check urine creatinine, urine sodium, urine urea -Hold nephrotoxic agents  Lower extremity swelling: Acute.  Patient notes worsening lower extremity edema.  At least 2+ pitting edema noted on physical exam.  BNP was within normal limits at 34.2.  Doppler ultrasound of lower extremities did not note any signs of a DVT. -Check TSH -Elevate lower extremities  Diabetes mellitus type 2 with hypoglycemia: Acute.  Patient noted to have episode of  hypoglycemia with blood sugars in the 40s this morning, but he reports that he had not taken glargine last night.  Home medication regimen includes glargine 70 units nightly, Ozempic 1 mg daily, and Januvia 50 mg daily. -Hypoglycemic protocols -Follow-up hemoglobin A1c -Hold home regimen Januvia, glargine, and Ozempic -Continue to monitor CBGs and will place on modified insulin regimen when medically appropriate  Normocytic anemia: Acute.  Hemoglobin 11.4 g/dL on admission.  Patient denies any reports of bleeding.  Notes negative Cologuard in 2021.  May be related with worsening kidney function. -Continue to monitor  Hyperlipidemia -Follow-up lipid panel -Continue Crestor and Zetia  PAD: Patient with prior history of stenting to left SFA in 2015. -Continue aspirin and statin  GERD -Continue pharmacy substitution of Protonix  Chronic back pain -Continue oxycodone as needed  Tobacco abuse: Patient has at least a 20 smoking pack-year history, but reports quitting a couple days ago. -Encourage continued cessation of tobacco use  DVT prophylaxis: Heparin Code Status: Full Family Communication: No family requested to be updated Disposition Plan: Hopefully discharge home once medically stable Consults called: Cardiology Admission status: Observation  Norval Morton MD [facility]   If 7PM-7AM, please contact night-coverage   12/14/2020, 9:25 AM

## 2020-12-14 NOTE — Progress Notes (Signed)
Echocardiogram 2D Echocardiogram has been performed.  Oneal Deputy Zunairah Devers RDCS 12/14/2020, 12:28 PM

## 2020-12-14 NOTE — Progress Notes (Signed)
Inpatient Diabetes Program Recommendations  AACE/ADA: New Consensus Statement on Inpatient Glycemic Control (2015)  Target Ranges:  Prepandial:   less than 140 mg/dL      Peak postprandial:   less than 180 mg/dL (1-2 hours)      Critically ill patients:  140 - 180 mg/dL   Lab Results  Component Value Date   GLUCAP 191 (H) 12/14/2020   HGBA1C 7.6 (H) 12/14/2020    Review of Glycemic Control  Diabetes history: DM2 Outpatient Diabetes medications: Basaglar70 units QHS, metformin 500 QD, Januvia 50 mg QD Current orders for Inpatient glycemic control: Novolog 0-15 units TID with meals  HgbA1C - 7.6%  Inpatient Diabetes Program Recommendations:    Add Semglee 35 units QHS  Spoke with pt at bedside regarding diabetes meds and hypoglycemia. Pt states he has lows occasionally, but usually eats 2 Hershey miniatures before bedtime. Discussed hypoglycemia s/s and treatment. Recommended pt to f/u with his diabetes doctor and will likely need lower dose of Basaglar to prevent low blood sugars. Also states he ran out of his Januvia and has not been taking his metformin on a regular basis. Emphasized importance of taking diabetes meds as prescribed and monitoring blood sugars at home.  Will need blood glucose meter kit prescription at discharge. Answered questions.   Thank you. Lorenda Peck, RD, LDN, CDE Inpatient Diabetes Coordinator [phone]

## 2020-12-15 ENCOUNTER — Observation Stay (HOSPITAL_BASED_OUTPATIENT_CLINIC_OR_DEPARTMENT_OTHER): Payer: Medicare Other

## 2020-12-15 DIAGNOSIS — R072 Precordial pain: Secondary | ICD-10-CM

## 2020-12-15 DIAGNOSIS — N179 Acute kidney failure, unspecified: Secondary | ICD-10-CM | POA: Diagnosis not present

## 2020-12-15 DIAGNOSIS — I160 Hypertensive urgency: Secondary | ICD-10-CM | POA: Diagnosis not present

## 2020-12-15 DIAGNOSIS — R079 Chest pain, unspecified: Secondary | ICD-10-CM | POA: Diagnosis not present

## 2020-12-15 DIAGNOSIS — N184 Chronic kidney disease, stage 4 (severe): Secondary | ICD-10-CM

## 2020-12-15 DIAGNOSIS — I739 Peripheral vascular disease, unspecified: Secondary | ICD-10-CM | POA: Diagnosis not present

## 2020-12-15 LAB — NM MYOCAR MULTI W/SPECT W/WALL MOTION / EF
Base ST Depression (mm): 0 mm
Estimated workload: 1
Exercise duration (min): 5 min
Exercise duration (sec): 44 s
Nuc Stress EF: 25 %
Peak HR: 100 {beats}/min
Rest HR: 58 {beats}/min
Rest Nuclear Isotope Dose: 11 mCi
ST Depression (mm): 0 mm
Stress Nuclear Isotope Dose: 29.3 mCi

## 2020-12-15 LAB — LIPID PANEL
Cholesterol: 103 mg/dL (ref 0–200)
HDL: 45 mg/dL (ref >40)
LDL Cholesterol: 43 mg/dL (ref 0–99)
Total CHOL/HDL Ratio: 2.3 RATIO
Triglycerides: 77 mg/dL (ref <150)
VLDL: 15 mg/dL (ref 0–40)

## 2020-12-15 LAB — BASIC METABOLIC PANEL
Anion gap: 8 (ref 5–15)
BUN: 17 mg/dL (ref 8–23)
CO2: 24 mmol/L (ref 22–32)
Calcium: 8.8 mg/dL — ABNORMAL LOW (ref 8.9–10.3)
Chloride: 104 mmol/L (ref 98–111)
Creatinine, Ser: 2.51 mg/dL — ABNORMAL HIGH (ref 0.61–1.24)
GFR, Estimated: 28 mL/min — ABNORMAL LOW (ref >60)
Glucose, Bld: 131 mg/dL — ABNORMAL HIGH (ref 70–99)
Potassium: 4.6 mmol/L (ref 3.5–5.1)
Sodium: 136 mmol/L (ref 135–145)

## 2020-12-15 LAB — GLUCOSE, CAPILLARY
Glucose-Capillary: 161 mg/dL — ABNORMAL HIGH (ref 70–99)
Glucose-Capillary: 50 mg/dL — ABNORMAL LOW (ref 70–99)
Glucose-Capillary: 100 mg/dL — ABNORMAL HIGH (ref 70–99)
Glucose-Capillary: 99 mg/dL (ref 70–99)

## 2020-12-15 LAB — UREA NITROGEN, URINE: Urea Nitrogen, Ur: 693 mg/dL

## 2020-12-15 MED ORDER — DEXTROSE 50 % IV SOLN
25.0000 g | INTRAVENOUS | Status: AC
  Administered 2020-12-15: 25 g via INTRAVENOUS

## 2020-12-15 MED ORDER — TECHNETIUM TC 99M TETROFOSMIN IV KIT
29.3000 | PACK | Freq: Once | INTRAVENOUS | Status: AC | PRN
  Administered 2020-12-15: 29.3 via INTRAVENOUS

## 2020-12-15 MED ORDER — DEXTROSE 50 % IV SOLN
INTRAVENOUS | Status: AC
  Filled 2020-12-15: qty 50

## 2020-12-15 MED ORDER — REGADENOSON 0.4 MG/5ML IV SOLN: 0.4000 mg | Freq: Once | INTRAVENOUS | Status: AC

## 2020-12-15 MED ORDER — TECHNETIUM TC 99M TETROFOSMIN IV KIT
11.0000 | PACK | Freq: Once | INTRAVENOUS | Status: AC | PRN
  Administered 2020-12-15: 11 via INTRAVENOUS

## 2020-12-15 MED ORDER — REGADENOSON 0.4 MG/5ML IV SOLN
INTRAVENOUS | Status: AC
  Administered 2020-12-15: 0.4 mg via INTRAVENOUS
  Filled 2020-12-15: qty 5

## 2020-12-15 NOTE — Progress Notes (Signed)
   I have personally reviewed Mr. Ackert nuclear stress test.  Although it was reported as high risk for low LV function, there appears to be a fixed inferior and inferoseptal defect.  On raw imaging there is clear inferior diaphragmatic attenuation artifact which likely led to miscalculation of the ejection fraction.  Echocardiogram yesterday showed low normal LVEF 50 to 55%.  I also personally reviewed those images as well.  My feeling is that the findings were artifactual.  He does have an elevated creatinine however initially I failed to realize that the comparator was a creatinine last in our system that was 7 years ago.  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  At this point he is not a candidate for cardiac catheterization due to his elevated creatinine.  His troponins were flat mildly elevated not consistent with acute coronary syndrome.  I would recommend medical therapy.  If heart rate can tolerate low-dose beta-blocker, isosorbide, aspirin and statin.  He can be discharged home today and follow-up with Kirby Funk, [name] at [facility] cardiology.  Pixie Casino, MD, [facility] [place] Director of the Advanced Lipid Disorders &  Cardiovascular Risk Reduction Clinic Diplomate of the American Board of Clinical Lipidology Attending Cardiologist  Direct Dial: [phone]  Fax: [phone]  Website:  [email or web address]

## 2020-12-15 NOTE — Progress Notes (Signed)
Discharge instructions provided to patient. Follow-up appointments reviewed and all questions answered. IV removed. Patient to be escorted home by his friend.   Gailen Shelter RN

## 2020-12-15 NOTE — Progress Notes (Deleted)
Stress test :  Findings are consistent with prior myocardial infarction with peri-infarct ischemia. The study is high risk.   No ST deviation was noted.   LV perfusion is abnormal. Defect 1: There is a medium defect with moderate reduction in uptake present in the mid to basal inferior and inferoseptal location(s) that is partially reversible. There is abnormal wall motion in the defect area. Consistent with infarction and peri-infarct ischemia.   Left ventricular function is abnormal. Global function is severely reduced. Nuclear stress EF: 25 %. The left ventricular ejection fraction is severely decreased (<30%). End diastolic cavity size is moderately enlarged.   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  Study is high risk due to EF of 25%, but when compared to Echo done yesterday ejection fraction was low normal 50-55%.    Dr. Debara Pickett personally reviewed stress test imaging.  This felt like fixed defect.  Doubt scar since echo was low normal with good regional wall motion abnormality.  Noted good nuke study.  It was felt that attenuation artifact likely messed up EF calculation. Would not support cardiac catheterization.  Recommended medical therapy given lack of chest pain.  Recommended aspirin, statin, Imdur and low-dose beta-blocker if heart rate supports. Follow-up with [name], [name] at [facility].

## 2020-12-15 NOTE — Progress Notes (Signed)
Patient asked to have CBG done to check his sugar. He has some concerns it might drop again this a.m.

## 2020-12-15 NOTE — Plan of Care (Signed)
  Problem: Education: Goal: Knowledge of General Education information will improve Description: Including pain rating scale, medication(s)/side effects and non-pharmacologic comfort measures 12/15/2020 1608 by Irene Pap, RN Outcome: Adequate for Discharge 12/15/2020 1606 by Irene Pap, RN Outcome: Progressing   Problem: Health Behavior/Discharge Planning: Goal: Ability to manage health-related needs will improve 12/15/2020 1608 by Irene Pap, RN Outcome: Adequate for Discharge 12/15/2020 1606 by Irene Pap, RN Outcome: Progressing   Problem: Clinical Measurements: Goal: Ability to maintain clinical measurements within normal limits will improve 12/15/2020 1608 by Irene Pap, RN Outcome: Adequate for Discharge 12/15/2020 1606 by Irene Pap, RN Outcome: Progressing Goal: Diagnostic test results will improve 12/15/2020 1608 by Irene Pap, RN Outcome: Adequate for Discharge 12/15/2020 1606 by Irene Pap, RN Outcome: Progressing

## 2020-12-15 NOTE — Progress Notes (Signed)
Robert Rocha presented for a nuclear stress test today.  No immediate complications.  Stress imaging is pending at this time.   Preliminary EKG findings may be listed in the chart, but the stress test result will not be finalized until perfusion imaging is complete.   [name], [place]  12/15/2020 11:46 AM

## 2020-12-15 NOTE — Discharge Summary (Addendum)
Physician Discharge Summary  Robert Rocha QPR:[id number] DOB: May 09, 1955 DOA: 12/13/2020  PCP: Secundino Ginger, [name]  Admit date: 12/13/2020 Discharge date: 12/15/2020    Admitted From: Home Disposition: Home  Recommendations for Outpatient Follow-up:  Follow up with PCP in 1-2 weeks Please obtain BMP/CBC in one week Please follow up with your PCP on the following pending results: Unresulted Labs (From admission, onward)     Start     Ordered   12/15/20 9935  Basic metabolic panel  Daily,   R     Question:  Specimen collection method  Answer:  Lab=Lab collect   12/15/20 0847              Home Health: None Equipment/Devices: None  Discharge Condition: Stable CODE STATUS: Full code Diet recommendation: Cardiac  Subjective: Seen and examined after Myoview.  Patient had no chest pain or any other complaint and was eager to go home.  Following HPI and ED course is copied from our dear admitting hospitalist colleague Dr. Thompson Caul H&P. HPI: Robert Rocha is a 65 y.o. male with medical history significant of hypertension, diabetes, hyperlipidemia, peripheral vascular disease s/p stent, and tobacco abuse who presents with complaints of intermittent chest pain.  Symptoms started initially 4 days ago after he woke up.  Patient reported having substernal chest pain that did not radiate, but prior to symptoms patient reported that he had woken up with low blood sugars.  Patient drank something with improvement in blood sugars. Due to his symptoms he was evaluated at the emergency department.  At that time work up for chest pain was unremarkable including high-sensitivity troponins negative x2.  He had been discharged home on isosorbide mononitrate 30 mg daily and recommended follow-up with his cardiologist.  He reported that he was doing okay until yesterday morning.  After he woke up he reported having an episode of diaphoresis that went away on its own.  After he sat down at the  kitchen table and was drinking some coffee he had acute onset of substernal chest pain that he describes as feeling like an elephant was sitting on his chest.  Noted associated symptoms of feeling his heart beating fast and has been having lower extremity leg swelling.  Denies having shortness of breath, nausea, vomiting, diarrhea, lightheadedness, shortness of breath, blood in stool(Cologuard negative in 2021), or dysuria.  His family history is significant for his mother having heart attack at the age of 31/59.  He previously had stress testing done back in 2013 that was noted to be within normal by Dr. Gwenlyn Found.  Patient notes that he is on 70 units of long-acting insulin at night, Januvia 50 mg daily, and Ozempic.  He did not take his long-acting insulin last night as his blood sugars seem to be a little low.  To his knowledge he has also never been told that he had any issues with his kidney function.  Patient admits to smoking half pack cigarettes per day on average and has done so approximately 41 years.  He states that he has quit smoking since couple days ago.  Lastly patient does report intermittently having left lower quadrant pain from time to time it goes away on its own.  Due to him having recurrent symptoms he asked his friend to take him back to [place] for further evaluation.   ED Course: Upon admission into the emergency department patient was seen to be afebrile, pulse 56-73, blood pressures 117/90 183/68, and O2 saturation maintained on room  air.  Labs significant for hemoglobin 11, BUN 21, creatinine 2.7, BNP 34.2, and high-sensitivity troponin 50->50.  Chest x-ray showed no acute abnormalities.  Influenza and COVID-19 screening was negative.  Doppler ultrasound of lower extremities negative.  Prior to transport patient had a drop in blood sugar into the 50s for which she was given an amp of D 50.  Brief/Interim Summary: Patient was admitted with chest pain.  EKG noted with minimal ST  depressions in inferior leads.  Troponins were slightly elevated 50> 50 but flat.  Patient underwent Myoview which did have fixed defect in the inferior wall.  This was reviewed by cardiology.  Per them, it is not reversible and thus cardiac cath was not recommended and medical management was recommended.  He was not cath candidate anyway due to his elevated creatinine with GFR around 24.  He also came in with hypertensive urgency.  His home medications were resumed.  His blood pressure is much better.  Currently it is 163/77.  Patient is symptom-free with no chest pain or shortness of breath.  He is cleared by cardiology to discharge.  He will follow-up with his PCP.  He has mild bradycardia and for that reason, beta-blocker were not added to his regimen.  It is recommended that if and when his heart rate improves, beta-blocker should be introduced in his home regimen as well.  Patient also came in with elevated creatinine of 2.7 with GFR of 28.  Last known creatinine available for [place] in the chart is from 2015 which was normal.  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  Patient's home medications also included lisinopril and hydrochlorothiazide and metformin but he was not taking any of those.  Due to elevated creatinine, they have been discontinued anyways.  Rest of his home medications were continued.  Addendum: After the discharge paperwork was done, I was informed by my admitting hospitalist partner that patient's creatinine was 1.5 in January 2021, noted on Care Everywhere.  Currently he is hovering around 2.5.  Earlier, I was unable to see any renal function other than the 1 in 2015 so it was assumed that patient has CKD stage IV.  Also I was informed that despite of patient not taking his Lantus, he had hypoglycemia.  Due to all of this, I recommended that patient stays in the hospital overnight for monitoring of  hypoglycemia and rechecking his renal function however after talking to the patient, he refused to comply with my recommendations and wanted to go home.  He was advised not to take any oral antidiabetic medications and take half dose of Lantus and follow with PCP as soon as possible for repeat renal function.  He promised that he would do that.  Discharge Diagnoses:  Active Problems:   PAD (peripheral artery disease): 11/22/11: PTA and stent to left SFA. 02/28/13: rotational attherectomy for ISR of SFA stent   Tobacco abuse   HLD (hyperlipidemia)   DM (diabetes mellitus) (HCC)   Chest pain   Hypertensive urgency   GERD (gastroesophageal reflux disease)   CKD (chronic kidney disease), stage IV ([place])    Discharge Instructions   Allergies as of 12/15/2020   No Known Allergies      Medication List     STOP taking these medications    lisinopril-hydrochlorothiazide 20-25 MG tablet Commonly known as: ZESTORETIC   metFORMIN 500 MG tablet Commonly known as: GLUCOPHAGE  TAKE these medications    Accu-Chek FastClix Lancets Misc 1 kit by Does not apply route 2 (two) times daily.   acetaminophen 500 MG tablet Commonly known as: TYLENOL Take 500 mg by mouth every 6 (six) hours as needed for mild pain, fever or headache.   albuterol 108 (90 Base) MCG/ACT inhaler Commonly known as: VENTOLIN HFA Inhale 2 puffs into the lungs every 6 (six) hours as needed for shortness of breath.   amLODipine 10 MG tablet Commonly known as: NORVASC Take 10 mg by mouth daily.   aspirin 81 MG chewable tablet Chew 81 mg by mouth daily.   baclofen 10 MG tablet Commonly known as: LIORESAL Take 10 mg by mouth 2 (two) times daily as needed for muscle spasms.   Basaglar KwikPen 100 UNIT/ML Inject 70 Units into the skin every evening.   cloNIDine 0.2 MG tablet Commonly known as: CATAPRES Take 0.2 mg by mouth 2 (two) times daily.   clopidogrel 75 MG tablet Commonly known as: PLAVIX TAKE  ONE TABLET BY MOUTH DAILY MUST MAKE APPOINTMENT FOR FURTHER REFILLS   ezetimibe 10 MG tablet Commonly known as: ZETIA Take 10 mg by mouth daily.   glucose blood test strip 1 each by Other route as needed for other. Use as instructed   isosorbide mononitrate 30 MG 24 hr tablet Commonly known as: IMDUR Take 1 tablet (30 mg total) by mouth daily.   Januvia 50 MG tablet Generic drug: sitaGLIPtin Take 50 mg by mouth daily.   NovoFine 32G X 6 MM Misc Generic drug: Insulin Pen Needle   omeprazole 40 MG capsule Commonly known as: PRILOSEC Take 40 mg by mouth daily.   omeprazole 20 MG capsule Commonly known as: PRILOSEC Take 20 mg by mouth daily.   oxyCODONE 15 MG immediate release tablet Commonly known as: ROXICODONE Take 15 mg by mouth 4 (four) times daily as needed for pain.   Ozempic (1 MG/DOSE) 4 MG/3ML Sopn Generic drug: Semaglutide (1 MG/DOSE) Inject 1 mg into the skin once a week.   rosuvastatin 40 MG tablet Commonly known as: CRESTOR Take 40 mg by mouth daily.   sildenafil 20 MG tablet Commonly known as: REVATIO Take 20-100 mg by mouth daily as needed for erectile dysfunction.   Vitamin D (Ergocalciferol) 1.25 MG (50000 UNIT) Caps capsule Commonly known as: DRISDOL Take 50,000 Units by mouth once a week.        Follow-up Information     Secundino Ginger, [name] Follow up in 1 week(s).   Specialty: Cardiology Contact information: [facility]  [address] [place] [address] [phone]                No Known Allergies  Consultations: Cardiology   Procedures/Studies: DG Chest 2 View  Result Date: 12/13/2020 CLINICAL DATA:  Chest pain EXAM: CHEST - 2 VIEW COMPARISON:  Chest radiograph dated 12/10/2020 FINDINGS: The heart size and mediastinal contours are within normal limits. Both lungs are clear. The visualized skeletal structures are unremarkable. IMPRESSION: No active cardiopulmonary disease. Electronically Signed   By:  Zerita Boers M.D.   On: 12/13/2020 17:15   DG Chest 2 View  Result Date: 12/10/2020 CLINICAL DATA:  Chest pain. Additional history provided: Patient reports low blood sugar, mid chest pain, history of diabetes and hypertension. EXAM: CHEST - 2 VIEW COMPARISON:  Prior chest radiographs 01/13/2017 and earlier. FINDINGS: Heart size within normal limits. No appreciable airspace consolidation. No evidence of pleural effusion or pneumothorax. No  acute bony abnormality identified. IMPRESSION: No evidence of active cardiopulmonary disease. Electronically Signed   By: Kellie Simmering D.O.   On: 12/10/2020 12:33   US RENAL  Result Date: 12/14/2020 CLINICAL DATA:  Acute kidney injury. EXAM: RENAL / URINARY TRACT ULTRASOUND COMPLETE COMPARISON:  CT AP 08/10/2017 FINDINGS: Right Kidney: Renal measurements: 10.9 x 4.7 x 6.5 cm = volume: 173.4 mL. Increased parenchymal echogenicity. No hydronephrosis. Small cyst within the inferior pole measures 0.8 x 0.7 x 0.9 cm. Left Kidney: Renal measurements: 11.1 x 6.4 x 5.8 cm = volume: 214.2 mL. Echogenicity within normal limits. No mass or hydronephrosis visualized. Bladder: Is partially decompressed.  Left ureteral jet not visualized. Other: Prostate gland measures 3.1 x 2.8 x 4.3 cm (volume = 20 cm^3). IMPRESSION: 1. No hydronephrosis. 2. Increased echogenicity of the right kidney suggesting chronic medical renal disease. Electronically Signed   By: Kerby Moors M.D.   On: 12/14/2020 13:28   NM Myocar Multi W/Spect W/Wall Motion / EF  Result Date: 12/15/2020   Findings are consistent with prior myocardial infarction with peri-infarct ischemia. The study is high risk.   No ST deviation was noted.   LV perfusion is abnormal. Defect 1: There is a medium defect with moderate reduction in uptake present in the mid to basal inferior and inferoseptal location(s) that is partially reversible. There is abnormal wall motion in the defect area. Consistent with infarction and  peri-infarct ischemia.   Left ventricular function is abnormal. Global function is severely reduced. Nuclear stress EF: 25 %. The left ventricular ejection fraction is severely decreased (<30%). End diastolic cavity size is moderately enlarged. 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  Study is high risk due to EF of 25%, but when compared to Echo done yesterday ejection fraction was low normal 50-55%.   US Venous Img Lower Bilateral (DVT)  Result Date: 12/13/2020 CLINICAL DATA:  Leg swelling.  Chest pain. EXAM: BILATERAL LOWER EXTREMITY VENOUS DOPPLER ULTRASOUND TECHNIQUE: Gray-scale sonography with graded compression, as well as color Doppler and duplex ultrasound were performed to evaluate the lower extremity deep venous systems from the level of the common femoral vein and including the common femoral, femoral, profunda femoral, popliteal and calf veins including the posterior tibial, peroneal and gastrocnemius veins when visible. The superficial great saphenous vein was also interrogated. Spectral Doppler was utilized to evaluate flow at rest and with distal augmentation maneuvers in the common femoral, femoral and popliteal veins. COMPARISON:  None. FINDINGS: RIGHT LOWER EXTREMITY Common Femoral Vein: No evidence of thrombus. Normal compressibility, respiratory phasicity and response to augmentation. Saphenofemoral Junction: No evidence of thrombus. Normal compressibility and flow on color Doppler imaging. Profunda Femoral Vein: No evidence of thrombus. Normal compressibility and flow on color Doppler imaging. Femoral Vein: No evidence of thrombus. Normal compressibility, respiratory phasicity and response to augmentation. Popliteal Vein: No evidence of thrombus. Normal compressibility, respiratory phasicity and response to augmentation. Calf Veins: No evidence of  thrombus. Normal compressibility and flow on color Doppler imaging. Superficial Great Saphenous Vein: No evidence of thrombus. Normal compressibility. Venous Reflux:  None. Other Findings:  None. LEFT LOWER EXTREMITY Common Femoral Vein: No evidence of thrombus. Normal compressibility, respiratory phasicity and response to augmentation. Saphenofemoral Junction: No evidence of thrombus. Normal compressibility and flow on color Doppler imaging. Profunda Femoral Vein: No evidence of thrombus. Normal compressibility and flow on color Doppler imaging. Femoral Vein:  No evidence of thrombus. Normal compressibility, respiratory phasicity and response to augmentation. Popliteal Vein: No evidence of thrombus. Normal compressibility, respiratory phasicity and response to augmentation. Calf Veins: No evidence of thrombus. Normal compressibility and flow on color Doppler imaging. Superficial Great Saphenous Vein: No evidence of thrombus. Normal compressibility. Venous Reflux:  None. Other Findings:  None. IMPRESSION: No evidence of deep venous thrombosis in either lower extremity. Electronically Signed   By: Iven Finn M.D.   On: 12/13/2020 16:59   ECHOCARDIOGRAM COMPLETE  Result Date: 12/14/2020    ECHOCARDIOGRAM REPORT   Patient Name:   Robert Rocha Date of Exam: 12/14/2020 Medical Rec #:  [id number]      Height:       70.0 in Accession #:    [id number]     Weight:       257.0 lb Date of Birth:  March 07, 1955      BSA:          2.322 m Patient Age:    80 years       BP:           160/79 mmHg Patient Gender: M              HR:           58 bpm. Exam Location:  Inpatient Procedure: 2D Echo, Color Doppler and Cardiac Doppler Indications:    R07.9* Chest pain, unspecified  History:        Patient has no prior history of Echocardiogram examinations.                 Risk Factors:Hypertension, Diabetes and Dyslipidemia.  Sonographer:    Raquel Sarna Senior RDCS Referring Phys: [phone] [place]  1. Left ventricular  ejection fraction, by estimation, is 50 to 55%. The left ventricle has low normal function. The left ventricle has no regional wall motion abnormalities. Left ventricular diastolic parameters were normal.  2. Right ventricular systolic function is normal. The right ventricular size is normal.  3. The mitral valve is grossly normal. Trivial mitral valve regurgitation.  4. The aortic valve is grossly normal. Aortic valve regurgitation is mild. FINDINGS  Left Ventricle: Left ventricular ejection fraction, by estimation, is 50 to 55%. The left ventricle has low normal function. The left ventricle has no regional wall motion abnormalities. The left ventricular internal cavity size was normal in size. There is no left ventricular hypertrophy. Left ventricular diastolic parameters were normal. Right Ventricle: The right ventricular size is normal. Right vetricular wall thickness was not well visualized. Right ventricular systolic function is normal. Left Atrium: Left atrial size was normal in size. Right Atrium: Right atrial size was normal in size. Pericardium: There is no evidence of pericardial effusion. Mitral Valve: The mitral valve is grossly normal. Trivial mitral valve regurgitation. Tricuspid Valve: The tricuspid valve is grossly normal. Tricuspid valve regurgitation is not demonstrated. Aortic Valve: The aortic valve is grossly normal. There is mild to moderate aortic valve annular calcification. Aortic valve regurgitation is mild. Pulmonic Valve: The pulmonic valve was grossly normal. Pulmonic valve regurgitation is not visualized. Aorta: The aortic root and ascending aorta are structurally normal, with no evidence of dilitation. IAS/Shunts: The atrial septum is grossly normal.  LEFT VENTRICLE PLAX 2D LVIDd:         5.10 cm   Diastology LVIDs:         3.70 cm   LV e' medial:    7.51 cm/s LV PW:  0.90 cm   LV E/e' medial:  10.4 LV IVS:        0.90 cm   LV e' lateral:   8.92 cm/s LVOT diam:     2.00 cm   LV  E/e' lateral: 8.7 LVOT Area:     3.14 cm  RIGHT VENTRICLE RV S prime:     13.40 cm/s TAPSE (M-mode): 1.8 cm LEFT ATRIUM             Index        RIGHT ATRIUM           Index LA diam:        2.80 cm 1.21 cm/m   RA Area:     12.80 cm LA Vol (A2C):   32.2 ml 13.87 ml/m  RA Volume:   30.60 ml  13.18 ml/m LA Vol (A4C):   46.2 ml 19.90 ml/m LA Biplane Vol: 39.6 ml 17.05 ml/m   AORTA Ao Root diam: 3.20 cm MITRAL VALVE MV Area (PHT): 2.26 cm    SHUNTS MV Decel Time: 335 msec    Systemic Diam: 2.00 cm MV E velocity: 77.80 cm/s MV A velocity: 83.20 cm/s MV E/A ratio:  0.94 Mertie Moores MD Electronically signed by Mertie Moores MD Signature Date/Time: 12/14/2020/3:19:10 PM    Final      Discharge Exam: Vitals:   12/15/20 1244 12/15/20 1549  BP: (!) 166/66 (!) 164/73  Pulse:  66  Resp: 19 15  Temp: 97.9 F (36.6 C)   SpO2: 100% 100%   Vitals:   12/15/20 1140 12/15/20 1142 12/15/20 1244 12/15/20 1549  BP: (!) 188/62 (!) 189/77 (!) 166/66 (!) 164/73  Pulse:    66  Resp:   19 15  Temp:   97.9 F (36.6 C)   TempSrc:   Oral   SpO2:   100% 100%  Weight:      Height:        General: Pt is alert, awake, not in acute distress Cardiovascular: RRR, S1/S2 +, no rubs, no gallops Respiratory: CTA bilaterally, no wheezing, no rhonchi Abdominal: Soft, NT, ND, bowel sounds + Extremities: no edema, no cyanosis    The results of significant diagnostics from this hospitalization (including imaging, microbiology, ancillary and laboratory) are listed below for reference.     Microbiology: Recent Results (from the past 240 hour(s))  Resp Panel by RT-PCR (Flu A&B, Covid) Nasopharyngeal Swab     Status: None   Collection Time: 12/13/20  4:38 PM   Specimen: Nasopharyngeal Swab; Nasopharyngeal(NP) swabs in vial transport medium  Result Value Ref Range Status   SARS Coronavirus 2 by RT PCR NEGATIVE NEGATIVE Final    Comment: (NOTE) SARS-CoV-2 target nucleic acids are NOT DETECTED.  The SARS-CoV-2 RNA  is generally detectable in upper respiratory specimens during the acute phase of infection. The lowest concentration of SARS-CoV-2 viral copies this assay can detect is 138 copies/mL. A negative result does not preclude SARS-Cov-2 infection and should not be used as the sole basis for treatment or other patient management decisions. A negative result may occur with  improper specimen collection/handling, submission of specimen other than nasopharyngeal swab, presence of viral mutation(s) within the areas targeted by this assay, and inadequate number of viral copies(<138 copies/mL). A negative result must be combined with clinical observations, patient history, and epidemiological information. The expected result is Negative.  Fact Sheet for Patients:  EntrepreneurPulse.com.au  Fact Sheet for Healthcare Providers:  IncredibleEmployment.be  This test is no t yet  approved or cleared by the [place] and  has been authorized for detection and/or diagnosis of SARS-CoV-2 by FDA under an Emergency Use Authorization (EUA). This EUA will remain  in effect (meaning this test can be used) for the duration of the COVID-19 declaration under Section 564(b)(1) of the Act, 21 U.S.C.section 360bbb-3(b)(1), unless the authorization is terminated  or revoked sooner.       Influenza A by PCR NEGATIVE NEGATIVE Final   Influenza B by PCR NEGATIVE NEGATIVE Final    Comment: (NOTE) The Xpert Xpress SARS-CoV-2/FLU/RSV plus assay is intended as an aid in the diagnosis of influenza from Nasopharyngeal swab specimens and should not be used as a sole basis for treatment. Nasal washings and aspirates are unacceptable for Xpert Xpress SARS-CoV-2/FLU/RSV testing.  Fact Sheet for Patients: EntrepreneurPulse.com.au  Fact Sheet for Healthcare Providers: IncredibleEmployment.be  This test is not yet approved or cleared by the  [place] FDA and has been authorized for detection and/or diagnosis of SARS-CoV-2 by FDA under an Emergency Use Authorization (EUA). This EUA will remain in effect (meaning this test can be used) for the duration of the COVID-19 declaration under Section 564(b)(1) of the Act, 21 U.S.C. section 360bbb-3(b)(1), unless the authorization is terminated or revoked.  Performed at [facility], [address] [place], [place] [address]      Labs: BNP (last 3 results) Recent Labs    12/13/20 1456  BNP 11.9   Basic Metabolic Panel: Recent Labs  Lab 12/10/20 1140 12/13/20 1456 12/15/20 1338  NA 140 138 136  K 3.7 4.3 4.6  CL 106 105 104  CO2 _0 GLUCOSE 118* 94 131*  BUN _1 CREATININE 2.49* 2.70* 2.51*  CALCIUM 9.0 8.9 8.8*   Liver Function Tests: Recent Labs  Lab 12/14/20 1130  AST 28  ALT 20  ALKPHOS 56  BILITOT 0.5  PROT 5.8*  ALBUMIN 2.7*   No results for input(s): LIPASE, AMYLASE in the last 168 hours. No results for input(s): AMMONIA in the last 168 hours. CBC: Recent Labs  Lab 12/10/20 1140 12/13/20 1456  WBC 12.3* 9.4  HGB 11.5* 11.0*  HCT 36.4* 34.5*  MCV 82.5 82.1  PLT 244 223   Cardiac Enzymes: No results for input(s): CKTOTAL, CKMB, CKMBINDEX, TROPONINI in the last 168 hours. BNP: Invalid input(s): POCBNP CBG: Recent Labs  Lab 12/14/20 2107 12/15/20 0209 12/15/20 0614 12/15/20 0641 12/15/20 1251  GLUCAP 133* 161* 50* 100* 99   D-Dimer No results for input(s): DDIMER in the last 72 hours. Hgb A1c Recent Labs    12/14/20 0951  HGBA1C 7.6*   Lipid Profile Recent Labs    12/15/20 0143  CHOL 103  HDL 45  LDLCALC 43  TRIG 77  CHOLHDL 2.3   Thyroid function studies Recent Labs    12/14/20 0951  TSH 2.039   Anemia work up No results for input(s): VITAMINB12, FOLATE, FERRITIN, TIBC, IRON, RETICCTPCT in the last 72 hours. Urinalysis    Component Value Date/Time   COLORURINE YELLOW 12/14/2020  [place] 12/14/2020 0937   LABSPEC 1.019 12/14/2020 0937   PHURINE 5.0 12/14/2020 0937   GLUCOSEU 150 (A) 12/14/2020 0937   HGBUR NEGATIVE 12/14/2020 [place] 12/14/2020 [place] 12/14/2020 0937   PROTEINUR >=300 (A) 12/14/2020 0937   NITRITE NEGATIVE 12/14/2020 0937   LEUKOCYTESUR NEGATIVE 12/14/2020 0937   Sepsis Labs Invalid input(s): PROCALCITONIN,  WBC,  LACTICIDVEN Microbiology Recent Results (from the past 240 hour(s))  Resp Panel by RT-PCR (Flu A&B, Covid) Nasopharyngeal Swab     Status: None   Collection Time: 12/13/20  4:38 PM   Specimen: Nasopharyngeal Swab; Nasopharyngeal(NP) swabs in vial transport medium  Result Value Ref Range Status   SARS Coronavirus 2 by RT PCR NEGATIVE NEGATIVE Final    Comment: (NOTE) SARS-CoV-2 target nucleic acids are NOT DETECTED.  The SARS-CoV-2 RNA is generally detectable in upper respiratory specimens during the acute phase of infection. The lowest concentration of SARS-CoV-2 viral copies this assay can detect is 138 copies/mL. A negative result does not preclude SARS-Cov-2 infection and should not be used as the sole basis for treatment or other patient management decisions. A negative result may occur with  improper specimen collection/handling, submission of specimen other than nasopharyngeal swab, presence of viral mutation(s) within the areas targeted by this assay, and inadequate number of viral copies(<138 copies/mL). A negative result must be combined with clinical observations, patient history, and epidemiological information. The expected result is Negative.  Fact Sheet for Patients:  EntrepreneurPulse.com.au  Fact Sheet for Healthcare Providers:  IncredibleEmployment.be  This test is no t yet approved or cleared by the [place] FDA and  has been authorized for detection and/or diagnosis of SARS-CoV-2 by FDA under an Emergency  Use Authorization (EUA). This EUA will remain  in effect (meaning this test can be used) for the duration of the COVID-19 declaration under Section 564(b)(1) of the Act, 21 U.S.C.section 360bbb-3(b)(1), unless the authorization is terminated  or revoked sooner.       Influenza A by PCR NEGATIVE NEGATIVE Final   Influenza B by PCR NEGATIVE NEGATIVE Final    Comment: (NOTE) The Xpert Xpress SARS-CoV-2/FLU/RSV plus assay is intended as an aid in the diagnosis of influenza from Nasopharyngeal swab specimens and should not be used as a sole basis for treatment. Nasal washings and aspirates are unacceptable for Xpert Xpress SARS-CoV-2/FLU/RSV testing.  Fact Sheet for Patients: EntrepreneurPulse.com.au  Fact Sheet for Healthcare Providers: IncredibleEmployment.be  This test is not yet approved or cleared by the [place] FDA and has been authorized for detection and/or diagnosis of SARS-CoV-2 by FDA under an Emergency Use Authorization (EUA). This EUA will remain in effect (meaning this test can be used) for the duration of the COVID-19 declaration under Section 564(b)(1) of the Act, 21 U.S.C. section 360bbb-3(b)(1), unless the authorization is terminated or revoked.  Performed at [facility], [address] [place], [place] [address]      Time coordinating discharge: Over 30 minutes  SIGNED:   Darliss Cheney, MD  [facility] 12/15/2020, 3:51 PM  If 7PM-7AM, please contact night-coverage [email or web address]

## 2020-12-15 NOTE — Progress Notes (Signed)
Patient Robert Rocha 49 with no complaints. 1/2 amp. D 50 given I.V. patient is NPO for test this a.m. Patient did have H.S. snack

## 2020-12-15 NOTE — Progress Notes (Signed)
DAILY PROGRESS NOTE   Patient Name: Nasire Reali Date of Encounter: 12/15/2020 Cardiologist: None  Chief Complaint   No chest pain  Patient Profile   Rishaan Gunner is a 65 y.o. male with a hx of HTN, HLD, DM II, tobacco abuse, CKD and PAD s/p LE stent who is being seen 12/14/2020 for the evaluation of chest pain at the request of Dr. Tamala Julian.  Subjective   No further chest pain - he wants to go home. He was seen in nuclear medicine. Plan myoview today. LDL low at 43. Echo showed low normal LVEF 50-55%, mild AI.  Objective   Vitals:   12/14/20 1947 12/15/20 0008 12/15/20 0350 12/15/20 0743  BP: (!) 149/61 (!) 146/68 135/60 (!) 174/66  Pulse: 68 67 60 (!) 59  Resp: 16 17 18 17   Temp: 98.3 F (36.8 C) 98.1 F (36.7 C) 98.3 F (36.8 C) 97.6 F (36.4 C)  TempSrc: Oral Oral Oral Oral  SpO2: 100% 99% 98% 100%  Weight:      Height:        Intake/Output Summary (Last 24 hours) at 12/15/2020 1043 Last data filed at 12/15/2020 0757 Gross per 24 hour  Intake 1599.58 ml  Output 1825 ml  Net -225.42 ml   Filed Weights   12/13/20 1320  Weight: 116.6 kg    Physical Exam   General appearance: alert and no distress Neck: no carotid bruit, no JVD, and thyroid not enlarged, symmetric, no tenderness/mass/nodules Lungs: clear to auscultation bilaterally Heart: regular rate and rhythm, S1, S2 normal, no murmur, click, rub or gallop Abdomen: soft, non-tender; bowel sounds normal; no masses,  no organomegaly Extremities: extremities normal, atraumatic, no cyanosis or edema Pulses: 2+ and symmetric Skin: Skin color, texture, turgor normal. No rashes or lesions Neurologic: Grossly normal Psych: Pleasant  Inpatient Medications    Scheduled Meds:  amLODipine  10 mg Oral Daily   aspirin  81 mg Oral Daily   cloNIDine  0.2 mg Oral BID   dextrose       ezetimibe  10 mg Oral Daily   heparin  5,000 Units Subcutaneous Q8H   insulin aspart  0-15 Units Subcutaneous TID WC    insulin glargine-yfgn  20 Units Subcutaneous QHS   pantoprazole  40 mg Oral Daily   rosuvastatin  40 mg Oral Daily    Continuous Infusions:  sodium chloride 75 mL/hr at 12/15/20 0605    PRN Meds: acetaminophen **OR** acetaminophen, albuterol, alum & mag hydroxide-simeth, hydrALAZINE, nicotine, ondansetron **OR** ondansetron (ZOFRAN) IV, oxyCODONE, traZODone   Labs   Results for orders placed or performed during the hospital encounter of 12/13/20 (from the past 48 hour(s))  Basic metabolic panel     Status: Abnormal   Collection Time: 12/13/20  2:56 PM  Result Value Ref Range   Sodium 138 135 - 145 mmol/L   Potassium 4.3 3.5 - 5.1 mmol/L   Chloride 105 98 - 111 mmol/L   CO2 26 22 - 32 mmol/L   Glucose, Bld 94 70 - 99 mg/dL    Comment: Glucose reference range applies only to samples taken after fasting for at least 8 hours.   BUN 21 8 - 23 mg/dL   Creatinine, Ser 2.70 (H) 0.61 - 1.24 mg/dL   Calcium 8.9 8.9 - 10.3 mg/dL   GFR, Estimated 25 (L) >60 mL/min    Comment: (NOTE) Calculated using the CKD-EPI Creatinine Equation (2021)    Anion gap 7 5 - 15    Comment:  Performed at [facility], [address] [place], [place] [address]  CBC     Status: Abnormal   Collection Time: 12/13/20  2:56 PM  Result Value Ref Range   WBC 9.4 4.0 - 10.5 K/uL   RBC 4.20 (L) 4.22 - 5.81 MIL/uL   Hemoglobin 11.0 (L) 13.0 - 17.0 g/dL   HCT 34.5 (L) 39.0 - 52.0 %   MCV 82.1 80.0 - 100.0 fL   MCH 26.2 26.0 - 34.0 pg   MCHC 31.9 30.0 - 36.0 g/dL   RDW 14.1 11.5 - 15.5 %   Platelets 223 150 - 400 K/uL   nRBC 0.0 0.0 - 0.2 %    Comment: Performed at [facility], [address] [place], [place] [address]  Brain natriuretic peptide     Status: None   Collection Time: 12/13/20  2:56 PM  Result Value Ref Range   B Natriuretic Peptide 34.2 0.0 - 100.0 pg/mL    Comment: Performed at [facility], [address] [place], [place] [address]  Troponin I (High  Sensitivity)     Status: Abnormal   Collection Time: 12/13/20  2:57 PM  Result Value Ref Range   Troponin I (High Sensitivity) 50 (H) <18 ng/L    Comment: (NOTE) Elevated high sensitivity troponin I (hsTnI) values and significant  changes across serial measurements may suggest ACS but many other  chronic and acute conditions are known to elevate hsTnI results.  Refer to the "Links" section for chest pain algorithms and additional  guidance. Performed at [facility], [address] [place], [place] [address]   Resp Panel by RT-PCR (Flu A&B, Covid) Nasopharyngeal Swab     Status: None   Collection Time: 12/13/20  4:38 PM   Specimen: Nasopharyngeal Swab; Nasopharyngeal(NP) swabs in vial transport medium  Result Value Ref Range   SARS Coronavirus 2 by RT PCR NEGATIVE NEGATIVE    Comment: (NOTE) SARS-CoV-2 target nucleic acids are NOT DETECTED.  The SARS-CoV-2 RNA is generally detectable in upper respiratory specimens during the acute phase of infection. The lowest concentration of SARS-CoV-2 viral copies this assay can detect is 138 copies/mL. A negative result does not preclude SARS-Cov-2 infection and should not be used as the sole basis for treatment or other patient management decisions. A negative result may occur with  improper specimen collection/handling, submission of specimen other than nasopharyngeal swab, presence of viral mutation(s) within the areas targeted by this assay, and inadequate number of viral copies(<138 copies/mL). A negative result must be combined with clinical observations, patient history, and epidemiological information. The expected result is Negative.  Fact Sheet for Patients:  EntrepreneurPulse.com.au  Fact Sheet for Healthcare Providers:  IncredibleEmployment.be  This test is no t yet approved or cleared by the [place] FDA and  has been authorized for detection and/or diagnosis of SARS-CoV-2  by FDA under an Emergency Use Authorization (EUA). This EUA will remain  in effect (meaning this test can be used) for the duration of the COVID-19 declaration under Section 564(b)(1) of the Act, 21 U.S.C.section 360bbb-3(b)(1), unless the authorization is terminated  or revoked sooner.       Influenza A by PCR NEGATIVE NEGATIVE   Influenza B by PCR NEGATIVE NEGATIVE    Comment: (NOTE) The Xpert Xpress SARS-CoV-2/FLU/RSV plus assay is intended as an aid in the diagnosis of influenza from Nasopharyngeal swab specimens and should not be used as a sole basis  for treatment. Nasal washings and aspirates are unacceptable for Xpert Xpress SARS-CoV-2/FLU/RSV testing.  Fact Sheet for Patients: EntrepreneurPulse.com.au  Fact Sheet for Healthcare Providers: IncredibleEmployment.be  This test is not yet approved or cleared by the [place] FDA and has been authorized for detection and/or diagnosis of SARS-CoV-2 by FDA under an Emergency Use Authorization (EUA). This EUA will remain in effect (meaning this test can be used) for the duration of the COVID-19 declaration under Section 564(b)(1) of the Act, 21 U.S.C. section 360bbb-3(b)(1), unless the authorization is terminated or revoked.  Performed at [facility], [address] [place], [place] [address]   Troponin I (High Sensitivity)     Status: Abnormal   Collection Time: 12/13/20  5:15 PM  Result Value Ref Range   Troponin I (High Sensitivity) 50 (H) <18 ng/L    Comment: (NOTE) Elevated high sensitivity troponin I (hsTnI) values and significant  changes across serial measurements may suggest ACS but many other  chronic and acute conditions are known to elevate hsTnI results.  Refer to the "Links" section for chest pain algorithms and additional  guidance. Performed at [facility], [address] [place], [place] [address]   CBG monitoring, ED     Status: None    Collection Time: 12/14/20 12:14 AM  Result Value Ref Range   Glucose-Capillary 70 70 - 99 mg/dL    Comment: Glucose reference range applies only to samples taken after fasting for at least 8 hours.  CBG monitoring, ED     Status: Abnormal   Collection Time: 12/14/20  6:51 AM  Result Value Ref Range   Glucose-Capillary 40 (LL) 70 - 99 mg/dL    Comment: Glucose reference range applies only to samples taken after fasting for at least 8 hours.   Comment 1 Notify RN   CBG monitoring, ED     Status: Abnormal   Collection Time: 12/14/20  7:24 AM  Result Value Ref Range   Glucose-Capillary 139 (H) 70 - 99 mg/dL    Comment: Glucose reference range applies only to samples taken after fasting for at least 8 hours.  Urinalysis, Routine w reflex microscopic     Status: Abnormal   Collection Time: 12/14/20  9:37 AM  Result Value Ref Range   Color, Urine YELLOW YELLOW   APPearance CLEAR CLEAR   Specific Gravity, Urine 1.019 1.005 - 1.030   pH 5.0 5.0 - 8.0   Glucose, UA 150 (A) NEGATIVE mg/dL   Hgb urine dipstick NEGATIVE NEGATIVE   Bilirubin Urine NEGATIVE NEGATIVE   Ketones, ur NEGATIVE NEGATIVE mg/dL   Protein, ur >=300 (A) NEGATIVE mg/dL   Nitrite NEGATIVE NEGATIVE   Leukocytes,Ua NEGATIVE NEGATIVE   RBC / HPF 0-5 0 - 5 RBC/hpf   WBC, UA 0-5 0 - 5 WBC/hpf   Bacteria, UA NONE SEEN NONE SEEN   Squamous Epithelial / LPF 0-5 0 - 5   Mucus PRESENT     Comment: Performed at [facility] [address], [place], [place] [address]  HIV Antibody (routine testing w rflx)     Status: None   Collection Time: 12/14/20  9:51 AM  Result Value Ref Range   HIV Screen 4th Generation wRfx Non Reactive Non Reactive    Comment: Performed at [facility], [place] [address], [place], [place] [address]  TSH     Status: None   Collection Time: 12/14/20  9:51 AM  Result Value Ref Range  TSH 2.039 0.350 - 4.500 uIU/mL    Comment: Performed by a 3rd Generation assay with a functional sensitivity of  <=0.01 uIU/mL. Performed at [facility], [place] [address], [place], [place] [address]   Hemoglobin A1c     Status: Abnormal   Collection Time: 12/14/20  9:51 AM  Result Value Ref Range   Hgb A1c MFr Bld 7.6 (H) 4.8 - 5.6 %    Comment: (NOTE) Pre diabetes:          5.7%-6.4%  Diabetes:              >6.4%  Glycemic control for   <7.0% adults with diabetes    Mean Plasma Glucose 171.42 mg/dL    Comment: Performed at [facility] [address] [place], [place] [address]  Glucose, capillary     Status: Abnormal   Collection Time: 12/14/20 10:27 AM  Result Value Ref Range   Glucose-Capillary 191 (H) 70 - 99 mg/dL    Comment: Glucose reference range applies only to samples taken after fasting for at least 8 hours.  Hepatic function panel     Status: Abnormal   Collection Time: 12/14/20 11:30 AM  Result Value Ref Range   Total Protein 5.8 (L) 6.5 - 8.1 g/dL   Albumin 2.7 (L) 3.5 - 5.0 g/dL   AST 28 15 - 41 U/L   ALT 20 0 - 44 U/L   Alkaline Phosphatase 56 38 - 126 U/L   Total Bilirubin 0.5 0.3 - 1.2 mg/dL   Bilirubin, Direct <0.1 0.0 - 0.2 mg/dL   Indirect Bilirubin NOT CALCULATED 0.3 - 0.9 mg/dL    Comment: Performed at [facility] [address], [place], [place] [address]  Creatinine, urine, random     Status: None   Collection Time: 12/14/20 12:00 PM  Result Value Ref Range   Creatinine, Urine 212.64 mg/dL    Comment: Performed at [facility] [address] [place], [place] [address]  Sodium, urine, random     Status: None   Collection Time: 12/14/20 12:00 PM  Result Value Ref Range   Sodium, Ur 65 mmol/L    Comment: Performed at [facility] [address] [place], [place] [address]  Glucose, capillary     Status: Abnormal   Collection Time: 12/14/20  4:28 PM  Result Value Ref Range   Glucose-Capillary 120 (H) 70 - 99 mg/dL    Comment: Glucose reference range applies only to samples taken after fasting for at least 8 hours.   Glucose, capillary     Status: Abnormal   Collection Time: 12/14/20  9:07 PM  Result Value Ref Range   Glucose-Capillary 133 (H) 70 - 99 mg/dL    Comment: Glucose reference range applies only to samples taken after fasting for at least 8 hours.  Lipid panel     Status: None   Collection Time: 12/15/20  1:43 AM  Result Value Ref Range   Cholesterol 103 0 - 200 mg/dL   Triglycerides 77 <150 mg/dL   HDL 45 >40 mg/dL   Total CHOL/HDL Ratio 2.3 RATIO   VLDL 15 0 - 40 mg/dL   LDL Cholesterol 43 0 - 99 mg/dL    Comment:        Total Cholesterol/HDL:CHD Risk Coronary Heart Disease Risk Table                     Men   Women  1/2 Average Risk   3.4   3.3  Average Risk       5.0   4.4  2 X Average Risk   9.6   7.1  3 X Average Risk  23.4   11.0        Use the calculated Patient Ratio above and the CHD Risk Table to determine the patient's CHD Risk.        ATP III CLASSIFICATION (LDL):  <100     mg/dL   Optimal  100-129  mg/dL   Near or Above                    Optimal  130-159  mg/dL   Borderline  160-189  mg/dL   High  >190     mg/dL   Very High Performed at [facility] [address] [place], [place] [address]   Glucose, capillary     Status: Abnormal   Collection Time: 12/15/20  2:09 AM  Result Value Ref Range   Glucose-Capillary 161 (H) 70 - 99 mg/dL    Comment: Glucose reference range applies only to samples taken after fasting for at least 8 hours.  Glucose, capillary     Status: Abnormal   Collection Time: 12/15/20  6:14 AM  Result Value Ref Range   Glucose-Capillary 50 (L) 70 - 99 mg/dL    Comment: Glucose reference range applies only to samples taken after fasting for at least 8 hours.  Glucose, capillary     Status: Abnormal   Collection Time: 12/15/20  6:41 AM  Result Value Ref Range   Glucose-Capillary 100 (H) 70 - 99 mg/dL    Comment: Glucose reference range applies only to samples taken after fasting for at least 8 hours.    ECG   N/A - Personally  Reviewed  Telemetry   Sinus rhythm - Personally Reviewed  Radiology    DG Chest 2 View  Result Date: 12/13/2020 CLINICAL DATA:  Chest pain EXAM: CHEST - 2 VIEW COMPARISON:  Chest radiograph dated 12/10/2020 FINDINGS: The heart size and mediastinal contours are within normal limits. Both lungs are clear. The visualized skeletal structures are unremarkable. IMPRESSION: No active cardiopulmonary disease. Electronically Signed   By: Zerita Boers M.D.   On: 12/13/2020 17:15   US RENAL  Result Date: 12/14/2020 CLINICAL DATA:  Acute kidney injury. EXAM: RENAL / URINARY TRACT ULTRASOUND COMPLETE COMPARISON:  CT AP 08/10/2017 FINDINGS: Right Kidney: Renal measurements: 10.9 x 4.7 x 6.5 cm = volume: 173.4 mL. Increased parenchymal echogenicity. No hydronephrosis. Small cyst within the inferior pole measures 0.8 x 0.7 x 0.9 cm. Left Kidney: Renal measurements: 11.1 x 6.4 x 5.8 cm = volume: 214.2 mL. Echogenicity within normal limits. No mass or hydronephrosis visualized. Bladder: Is partially decompressed.  Left ureteral jet not visualized. Other: Prostate gland measures 3.1 x 2.8 x 4.3 cm (volume = 20 cm^3). IMPRESSION: 1. No hydronephrosis. 2. Increased echogenicity of the right kidney suggesting chronic medical renal disease. Electronically Signed   By: Kerby Moors M.D.   On: 12/14/2020 13:28   US Venous Img Lower Bilateral (DVT)  Result Date: 12/13/2020 CLINICAL DATA:  Leg swelling.  Chest pain. EXAM: BILATERAL LOWER EXTREMITY VENOUS DOPPLER ULTRASOUND TECHNIQUE: Gray-scale sonography with graded compression, as well as color Doppler and duplex ultrasound were performed to evaluate the lower extremity deep venous systems from the level of the common femoral vein and including the common femoral, femoral, profunda femoral, popliteal and  calf veins including the posterior tibial, peroneal and gastrocnemius veins when visible. The superficial great saphenous vein was also interrogated. Spectral  Doppler was utilized to evaluate flow at rest and with distal augmentation maneuvers in the common femoral, femoral and popliteal veins. COMPARISON:  None. FINDINGS: RIGHT LOWER EXTREMITY Common Femoral Vein: No evidence of thrombus. Normal compressibility, respiratory phasicity and response to augmentation. Saphenofemoral Junction: No evidence of thrombus. Normal compressibility and flow on color Doppler imaging. Profunda Femoral Vein: No evidence of thrombus. Normal compressibility and flow on color Doppler imaging. Femoral Vein: No evidence of thrombus. Normal compressibility, respiratory phasicity and response to augmentation. Popliteal Vein: No evidence of thrombus. Normal compressibility, respiratory phasicity and response to augmentation. Calf Veins: No evidence of thrombus. Normal compressibility and flow on color Doppler imaging. Superficial Great Saphenous Vein: No evidence of thrombus. Normal compressibility. Venous Reflux:  None. Other Findings:  None. LEFT LOWER EXTREMITY Common Femoral Vein: No evidence of thrombus. Normal compressibility, respiratory phasicity and response to augmentation. Saphenofemoral Junction: No evidence of thrombus. Normal compressibility and flow on color Doppler imaging. Profunda Femoral Vein: No evidence of thrombus. Normal compressibility and flow on color Doppler imaging. Femoral Vein: No evidence of thrombus. Normal compressibility, respiratory phasicity and response to augmentation. Popliteal Vein: No evidence of thrombus. Normal compressibility, respiratory phasicity and response to augmentation. Calf Veins: No evidence of thrombus. Normal compressibility and flow on color Doppler imaging. Superficial Great Saphenous Vein: No evidence of thrombus. Normal compressibility. Venous Reflux:  None. Other Findings:  None. IMPRESSION: No evidence of deep venous thrombosis in either lower extremity. Electronically Signed   By: Iven Finn M.D.   On: 12/13/2020 16:59    ECHOCARDIOGRAM COMPLETE  Result Date: 12/14/2020    ECHOCARDIOGRAM REPORT   Patient Name:   HERMES WAFER Date of Exam: 12/14/2020 Medical Rec #:  [id number]      Height:       70.0 in Accession #:    [id number]     Weight:       257.0 lb Date of Birth:  1955-09-27      BSA:          2.322 m Patient Age:    80 years       BP:           160/79 mmHg Patient Gender: M              HR:           58 bpm. Exam Location:  Inpatient Procedure: 2D Echo, Color Doppler and Cardiac Doppler Indications:    R07.9* Chest pain, unspecified  History:        Patient has no prior history of Echocardiogram examinations.                 Risk Factors:Hypertension, Diabetes and Dyslipidemia.  Sonographer:    Raquel Sarna Senior RDCS Referring Phys: [phone] [place]  1. Left ventricular ejection fraction, by estimation, is 50 to 55%. The left ventricle has low normal function. The left ventricle has no regional wall motion abnormalities. Left ventricular diastolic parameters were normal.  2. Right ventricular systolic function is normal. The right ventricular size is normal.  3. The mitral valve is grossly normal. Trivial mitral valve regurgitation.  4. The aortic valve is grossly normal. Aortic valve regurgitation is mild. FINDINGS  Left Ventricle: Left ventricular ejection fraction, by estimation, is 50 to 55%. The left ventricle has low normal function. The left ventricle has no regional  wall motion abnormalities. The left ventricular internal cavity size was normal in size. There is no left ventricular hypertrophy. Left ventricular diastolic parameters were normal. Right Ventricle: The right ventricular size is normal. Right vetricular wall thickness was not well visualized. Right ventricular systolic function is normal. Left Atrium: Left atrial size was normal in size. Right Atrium: Right atrial size was normal in size. Pericardium: There is no evidence of pericardial effusion. Mitral Valve: The mitral valve is  grossly normal. Trivial mitral valve regurgitation. Tricuspid Valve: The tricuspid valve is grossly normal. Tricuspid valve regurgitation is not demonstrated. Aortic Valve: The aortic valve is grossly normal. There is mild to moderate aortic valve annular calcification. Aortic valve regurgitation is mild. Pulmonic Valve: The pulmonic valve was grossly normal. Pulmonic valve regurgitation is not visualized. Aorta: The aortic root and ascending aorta are structurally normal, with no evidence of dilitation. IAS/Shunts: The atrial septum is grossly normal.  LEFT VENTRICLE PLAX 2D LVIDd:         5.10 cm   Diastology LVIDs:         3.70 cm   LV e' medial:    7.51 cm/s LV PW:         0.90 cm   LV E/e' medial:  10.4 LV IVS:        0.90 cm   LV e' lateral:   8.92 cm/s LVOT diam:     2.00 cm   LV E/e' lateral: 8.7 LVOT Area:     3.14 cm  RIGHT VENTRICLE RV S prime:     13.40 cm/s TAPSE (M-mode): 1.8 cm LEFT ATRIUM             Index        RIGHT ATRIUM           Index LA diam:        2.80 cm 1.21 cm/m   RA Area:     12.80 cm LA Vol (A2C):   32.2 ml 13.87 ml/m  RA Volume:   30.60 ml  13.18 ml/m LA Vol (A4C):   46.2 ml 19.90 ml/m LA Biplane Vol: 39.6 ml 17.05 ml/m   AORTA Ao Root diam: 3.20 cm MITRAL VALVE MV Area (PHT): 2.26 cm    SHUNTS MV Decel Time: 335 msec    Systemic Diam: 2.00 cm MV E velocity: 77.80 cm/s MV A velocity: 83.20 cm/s MV E/A ratio:  0.94 Mertie Moores MD Electronically signed by Mertie Moores MD Signature Date/Time: 12/14/2020/3:19:10 PM    Final     Cardiac Studies   See above  Assessment   Active Problems:   PAD (peripheral artery disease): 11/22/11: PTA and stent to left SFA. 02/28/13: rotational attherectomy for ISR of SFA stent   Tobacco abuse   HLD (hyperlipidemia)   DM (diabetes mellitus) (HCC)   Chest pain   Acute kidney injury superimposed on chronic kidney disease (HCC)   Hypertensive urgency   GERD (gastroesophageal reflux disease)   Plan   Pending myoview stress test  today - echo with mild abnormalities, no clear regional WMA's. Mild AI. Needs better BP control. Further recs based on myoview results. Follow-up on BMET / AKI.  Time Spent Directly with Patient:  I have spent a total of 25 minutes with the patient reviewing hospital notes, telemetry, EKGs, labs and examining the patient as well as establishing an assessment and plan that was discussed personally with the patient.  > 50% of time was spent in direct patient care.  Length  of Stay:  LOS: 0 days   Pixie Casino, MD, [facility], [place] Director of the [facility] Lipid Disorders &  Cardiovascular Risk Reduction Clinic Diplomate of the American Board of Clinical Lipidology Attending Cardiologist  Direct Dial: [phone]  Fax: [phone]  Website:  [email or web address] Osgood Rodarius Kichline 12/15/2020, 10:43 AM

## 2020-12-15 NOTE — Progress Notes (Signed)
Repeat CBG 100. Patient feels like he has energy now.

## 2021-01-17 ENCOUNTER — Other Ambulatory Visit: Payer: Self-pay

## 2021-01-17 ENCOUNTER — Emergency Department (HOSPITAL_BASED_OUTPATIENT_CLINIC_OR_DEPARTMENT_OTHER)
Admission: EM | Admit: 2021-01-17 | Discharge: 2021-01-17 | Disposition: A | Discharge status: Home / Self Care | Payer: Medicare Other | Attending: Emergency Medicine | Admitting: Emergency Medicine

## 2021-01-17 ENCOUNTER — Emergency Department (HOSPITAL_BASED_OUTPATIENT_CLINIC_OR_DEPARTMENT_OTHER): Payer: Medicare Other

## 2021-01-17 ENCOUNTER — Encounter (HOSPITAL_BASED_OUTPATIENT_CLINIC_OR_DEPARTMENT_OTHER): Payer: Self-pay | Admitting: Emergency Medicine

## 2021-01-17 DIAGNOSIS — Z7982 Long term (current) use of aspirin: Secondary | ICD-10-CM | POA: Diagnosis not present

## 2021-01-17 DIAGNOSIS — F17210 Nicotine dependence, cigarettes, uncomplicated: Secondary | ICD-10-CM | POA: Insufficient documentation

## 2021-01-17 DIAGNOSIS — Z7902 Long term (current) use of antithrombotics/antiplatelets: Secondary | ICD-10-CM | POA: Diagnosis not present

## 2021-01-17 DIAGNOSIS — E1122 Type 2 diabetes mellitus with diabetic chronic kidney disease: Secondary | ICD-10-CM | POA: Insufficient documentation

## 2021-01-17 DIAGNOSIS — R072 Precordial pain: Secondary | ICD-10-CM | POA: Insufficient documentation

## 2021-01-17 DIAGNOSIS — N184 Chronic kidney disease, stage 4 (severe): Secondary | ICD-10-CM | POA: Diagnosis not present

## 2021-01-17 DIAGNOSIS — I129 Hypertensive chronic kidney disease with stage 1 through stage 4 chronic kidney disease, or unspecified chronic kidney disease: Secondary | ICD-10-CM | POA: Diagnosis not present

## 2021-01-17 DIAGNOSIS — Z79899 Other long term (current) drug therapy: Secondary | ICD-10-CM | POA: Insufficient documentation

## 2021-01-17 DIAGNOSIS — Z794 Long term (current) use of insulin: Secondary | ICD-10-CM | POA: Diagnosis not present

## 2021-01-17 DIAGNOSIS — R079 Chest pain, unspecified: Secondary | ICD-10-CM

## 2021-01-17 LAB — CBC WITH DIFFERENTIAL/PLATELET
Abs Immature Granulocytes: 0.02 10*3/uL (ref 0.00–0.07)
Basophils Absolute: 0.1 10*3/uL (ref 0.0–0.1)
Basophils Relative: 1 %
Eosinophils Absolute: 0.3 10*3/uL (ref 0.0–0.5)
Eosinophils Relative: 4 %
HCT: 31.3 % — ABNORMAL LOW (ref 39.0–52.0)
Hemoglobin: 10 g/dL — ABNORMAL LOW (ref 13.0–17.0)
Immature Granulocytes: 0 %
Lymphocytes Relative: 28 %
Lymphs Abs: 2 10*3/uL (ref 0.7–4.0)
MCH: 26.2 pg (ref 26.0–34.0)
MCHC: 31.9 g/dL (ref 30.0–36.0)
MCV: 82.2 fL (ref 80.0–100.0)
Monocytes Absolute: 0.6 10*3/uL (ref 0.1–1.0)
Monocytes Relative: 9 %
Neutro Abs: 4.2 10*3/uL (ref 1.7–7.7)
Neutrophils Relative %: 58 %
Platelets: 170 10*3/uL (ref 150–400)
RBC: 3.81 MIL/uL — ABNORMAL LOW (ref 4.22–5.81)
RDW: 14.1 % (ref 11.5–15.5)
WBC: 7.1 10*3/uL (ref 4.0–10.5)
nRBC: 0 % (ref 0.0–0.2)

## 2021-01-17 LAB — COMPREHENSIVE METABOLIC PANEL
ALT: 29 U/L (ref 0–44)
AST: 27 U/L (ref 15–41)
Albumin: 3.2 g/dL — ABNORMAL LOW (ref 3.5–5.0)
Alkaline Phosphatase: 65 U/L (ref 38–126)
Anion gap: 8 (ref 5–15)
BUN: 21 mg/dL (ref 8–23)
CO2: 23 mmol/L (ref 22–32)
Calcium: 8.5 mg/dL — ABNORMAL LOW (ref 8.9–10.3)
Chloride: 103 mmol/L (ref 98–111)
Creatinine, Ser: 3.09 mg/dL — ABNORMAL HIGH (ref 0.61–1.24)
GFR, Estimated: 21 mL/min — ABNORMAL LOW (ref >60)
Glucose, Bld: 84 mg/dL (ref 70–99)
Potassium: 3.1 mmol/L — ABNORMAL LOW (ref 3.5–5.1)
Sodium: 134 mmol/L — ABNORMAL LOW (ref 135–145)
Total Bilirubin: 0.3 mg/dL (ref 0.3–1.2)
Total Protein: 6.7 g/dL (ref 6.5–8.1)

## 2021-01-17 LAB — TROPONIN I (HIGH SENSITIVITY): Troponin I (High Sensitivity): 16 ng/L (ref <18)

## 2021-01-17 MED ORDER — NITROGLYCERIN 0.4 MG SL SUBL
0.4000 mg | SUBLINGUAL_TABLET | SUBLINGUAL | Status: DC | PRN
  Administered 2021-01-17: 06:00:00 0.4 mg via SUBLINGUAL
  Filled 2021-01-17: qty 1

## 2021-01-17 MED ORDER — ISOSORBIDE MONONITRATE ER 30 MG PO TB24: 30.0000 mg | ORAL_TABLET | Freq: Every day | ORAL | 1 refills | Status: AC

## 2021-01-17 NOTE — ED Provider Notes (Signed)
[facility] DEPARTMENT Provider Note   CSN: [id number] Arrival date & time: 01/17/21  0450     History Chief Complaint  Patient presents with   Chest Pain    Robert Rocha is a 65 y.o. male.  Patient out of [facility] and feels that this is causing increase in his anginal symptoms. Can't get it refilled for over a week. Had an episode of it tonight which brought him in. No fever, nausea, sob, dizziness, diaphoresis. No other associated. Symptoms.   The history is provided by the patient.  Chest Pain Pain location:  Substernal area Pain quality: no pressure   Pain radiates to:  Does not radiate Pain severity:  Mild     Past Medical History:  Diagnosis Date   Arthritis    Depression    Diabetes mellitus    insulin dependent   Fibromyalgia    GERD (gastroesophageal reflux disease)    Hyperlipidemia    Hypertension    Peripheral vascular disease ([place])     Patient Active Problem List   Diagnosis Date Noted   CKD (chronic kidney disease), stage IV ([place]) 12/15/2020   Acute kidney injury superimposed on chronic kidney disease ([place]) 12/14/2020   Hypertensive urgency 12/14/2020   GERD (gastroesophageal reflux disease) 12/14/2020   Chest pain 12/13/2020   Claudication ([place]) 02/28/2013   PAD (peripheral artery disease): 11/22/11: PTA and stent to left SFA. 02/28/13: rotational attherectomy for ISR of SFA stent 11/23/2011   HTN (hypertension) 11/23/2011   Tobacco abuse 11/23/2011   HLD (hyperlipidemia) 11/23/2011   DM (diabetes mellitus) ([place]) 11/23/2011    Past Surgical History:  Procedure Laterality Date   ATHERECTOMY  02/28/2013   STENT    TO SFA   FEMORAL ARTERY STENT     LOWER EXTREMITY ANGIOGRAM N/A 11/22/2011   Procedure: LOWER EXTREMITY ANGIOGRAM;  Surgeon: Lorretta Harp, MD;  Location: [facility] CATH LAB;  Service: Cardiovascular;  Laterality: N/A;   LOWER EXTREMITY ANGIOGRAM N/A 02/28/2013   Procedure: LOWER EXTREMITY ANGIOGRAM;  Surgeon: Lorretta Harp, MD;  Location: [facility] CATH LAB;  Service: Cardiovascular;  Laterality: N/A;       Family History  Problem Relation Age of Onset   Heart attack Mother 25    Social History   Tobacco Use   Smoking status: Every Day    Packs/day: 0.50    Years: 40.00    Pack years: 20.00    Types: Cigarettes   Smokeless tobacco: Never  Vaping Use   Vaping Use: Never used  Substance Use Topics   Alcohol use: No   Drug use: No    Home Medications Prior to Admission medications   Medication Sig Start Date End Date Taking? Authorizing Provider  isosorbide mononitrate (IMDUR) 30 MG 24 hr tablet Take 1 tablet (30 mg total) by mouth daily. 01/17/21  Yes Jodee Wagenaar, Corene Cornea, MD  ACCU-CHEK FASTCLIX LANCETS MISC 1 kit by Does not apply route 2 (two) times daily.    [provider]  acetaminophen (TYLENOL) 500 MG tablet Take 500 mg by mouth every 6 (six) hours as needed for mild pain, fever or headache.    [provider]  albuterol (PROVENTIL HFA;VENTOLIN HFA) 108 (90 BASE) MCG/ACT inhaler Inhale 2 puffs into the lungs every 6 (six) hours as needed for shortness of breath.    [provider]  amLODipine (NORVASC) 10 MG tablet Take 10 mg by mouth daily. 11/19/20   [provider]  aspirin 81 MG chewable tablet  Chew 81 mg by mouth daily.    [provider]  baclofen (LIORESAL) 10 MG tablet Take 10 mg by mouth 2 (two) times daily as needed for muscle spasms.    [provider]  cloNIDine (CATAPRES) 0.2 MG tablet Take 0.2 mg by mouth 2 (two) times daily. 10/23/20   [provider]  clopidogrel (PLAVIX) 75 MG tablet TAKE ONE TABLET BY MOUTH DAILY MUST MAKE APPOINTMENT FOR FURTHER REFILLS Patient not taking: Reported on 12/14/2020 04/28/14   Lorretta Harp, MD  ezetimibe (ZETIA) 10 MG tablet Take 10 mg by mouth daily.    [provider]  glucose blood test strip 1 each by Other route as needed for other. Use as instructed    [provider]  Insulin Glargine (BASAGLAR KWIKPEN) 100 UNIT/ML Inject 70 Units into the skin every evening. 11/24/20   [provider]  isosorbide mononitrate (IMDUR) 30 MG 24 hr tablet Take 1 tablet (30 mg total) by mouth daily. 12/10/20 01/09/21  Godfrey Pick, MD  JANUVIA 50 MG tablet Take 50 mg by mouth daily. 10/20/20   [provider]  NOVOFINE 32G X 6 MM MISC  12/14/12   [provider]  omeprazole (PRILOSEC) 20 MG capsule Take 20 mg by mouth daily. Patient not taking: Reported on 12/14/2020    [provider]  omeprazole (PRILOSEC) 40 MG capsule Take 40 mg by mouth daily. 10/19/20   [provider]  oxyCODONE (ROXICODONE) 15 MG immediate release tablet Take 15 mg by mouth 4 (four) times daily as needed for pain. 11/23/20   [provider]  OZEMPIC, 1 MG/DOSE, 4 MG/3ML SOPN Inject 1 mg into the skin once a week. 11/19/20   [provider]  rosuvastatin (CRESTOR) 40 MG tablet Take 40 mg by mouth daily.    [provider]  sildenafil (REVATIO) 20 MG tablet Take 20-100 mg by mouth daily as needed for erectile dysfunction. 11/26/20   [provider]  Vitamin D, Ergocalciferol, (DRISDOL) 1.25 MG (50000 UNIT) CAPS capsule Take 50,000 Units by mouth once a week. 11/19/20   [provider]    Allergies    Patient has no known allergies.  Review of Systems   Review of Systems  Cardiovascular:  Positive for chest pain.  All other systems reviewed and are negative.  Physical Exam Updated Vital Signs BP (!) 176/65   Pulse 65   Temp 98.1 F (36.7 C) (Oral)   Resp 12   Ht _0  (1.803 m)   Wt 112 kg   SpO2 100%   BMI 34.45 kg/m   Physical Exam Vitals and nursing note reviewed.  Constitutional:      Appearance: He is well-developed.  HENT:     Head: Normocephalic and atraumatic.  Cardiovascular:     Rate and Rhythm: Normal rate.  Pulmonary:     Effort: Pulmonary effort is normal. No respiratory distress.   Chest:     Chest wall: No mass, deformity or tenderness.  Abdominal:     General: There is no distension.     Palpations: Abdomen is soft.  Musculoskeletal:        General: Normal range of motion.     Cervical back: Normal range of motion.  Skin:    General: Skin is warm and dry.  Neurological:     Mental Status: He is alert.    ED Results / Procedures / Treatments   Labs (all labs ordered are listed, but only  abnormal results are displayed) Labs Reviewed  CBC WITH DIFFERENTIAL/PLATELET - Abnormal; Notable for the following components:      Result Value   RBC 3.81 (*)    Hemoglobin 10.0 (*)    HCT 31.3 (*)    All other components within normal limits  COMPREHENSIVE METABOLIC PANEL - Abnormal; Notable for the following components:   Sodium 134 (*)    Potassium 3.1 (*)    Creatinine, Ser 3.09 (*)    Calcium 8.5 (*)    Albumin 3.2 (*)    GFR, Estimated 21 (*)    All other components within normal limits  TROPONIN I (HIGH SENSITIVITY)    EKG EKG Interpretation  Date/Time:  _0 /27/22 3557             Yanil Dawe, Corene Cornea, MD 01/17/21 [phone]

## 2021-01-17 NOTE — ED Notes (Signed)
Patient discharged to home.  All discharge instructions reviewed.  Patient verbalized understanding via teachback method.  VS WDL.  Respirations even and unlabored.  Ambulatory out of ED.   °

## 2021-01-17 NOTE — ED Triage Notes (Signed)
Per pt CP since 0200 without any other sx. Pt states the pain is similar to CP he has experienced in the past. Pt was asleep when pain started and realized he had it when he got up to use the restroom.

## 2023-04-09 IMAGING — US US RENAL
2 series · 14 of 25 positions shown · non-contrast
Comparison: CT AP 08/10/2017

CLINICAL DATA: Acute kidney injury.

EXAM:
RENAL / URINARY TRACT ULTRASOUND COMPLETE

[Series 1: us renal · 13 of 46 slices shown (1 of 2)]
[im 1/46]
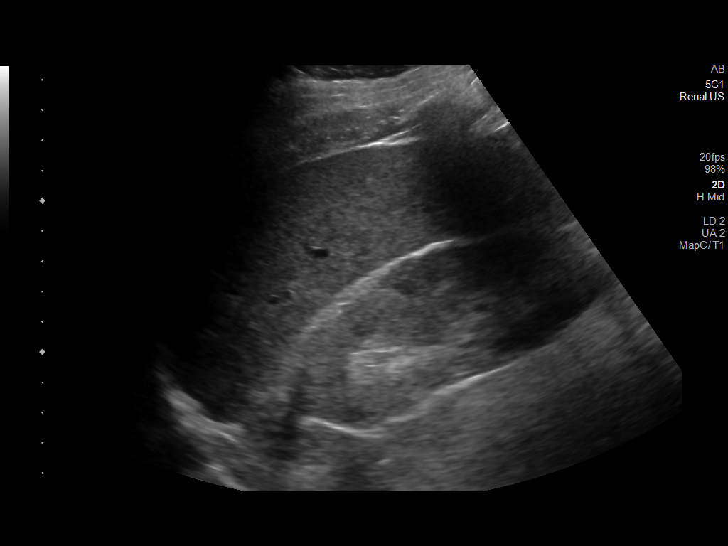
[im 5/46]
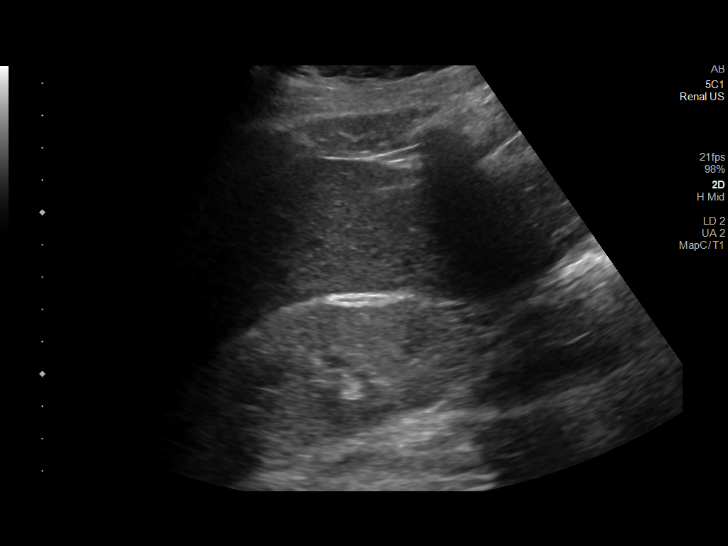
[im 9/46]
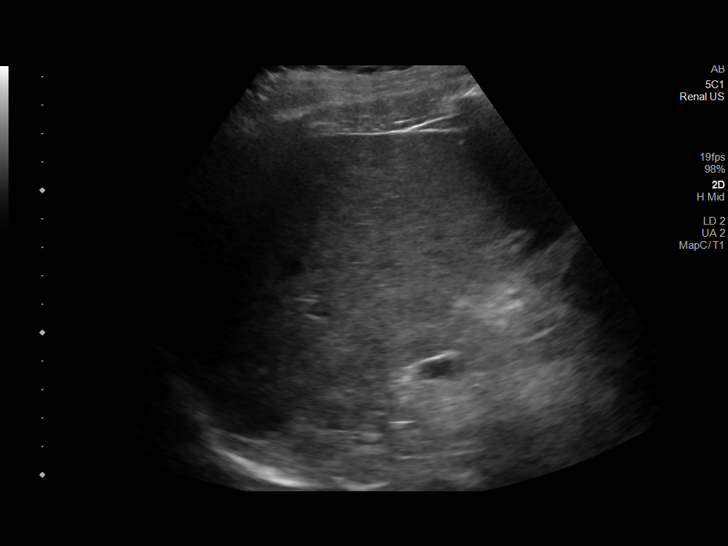
[im 13/46]
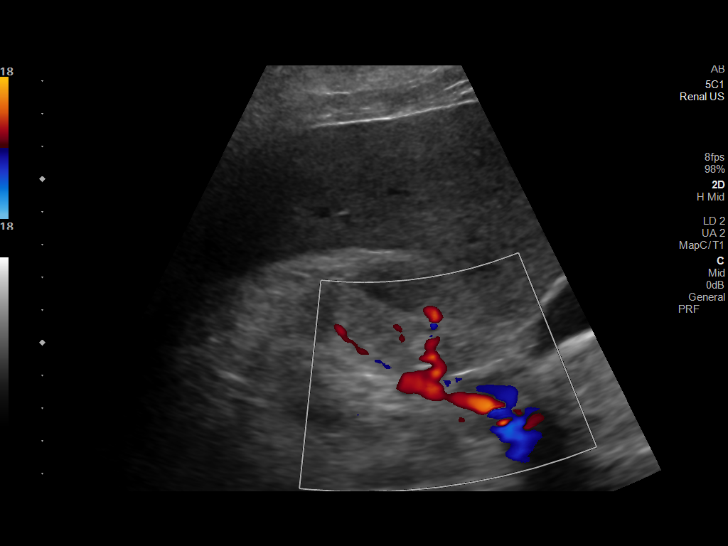
[im 17/46]
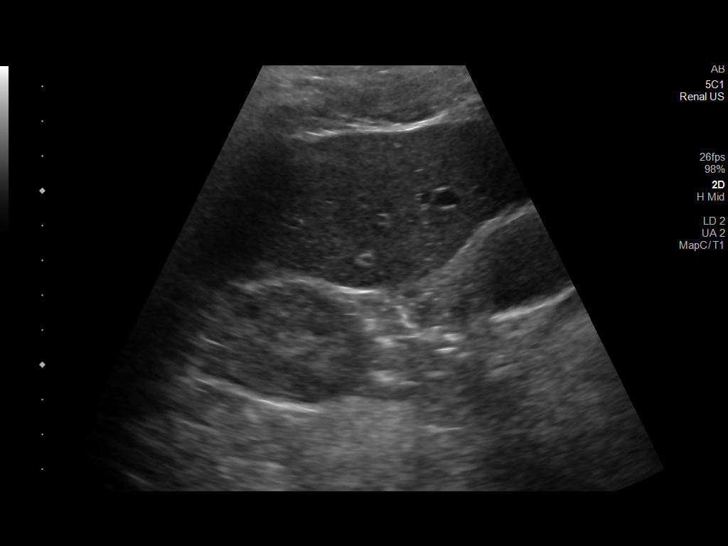
[im 19/46]
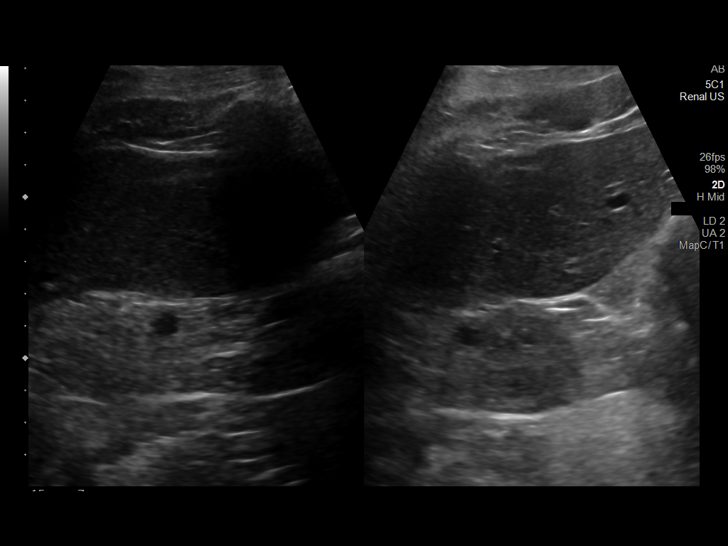
[im 23/46]
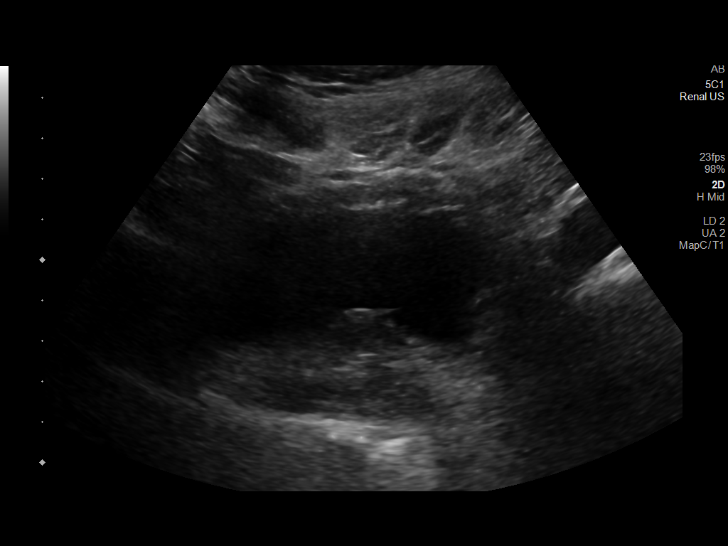
[im 27/46]
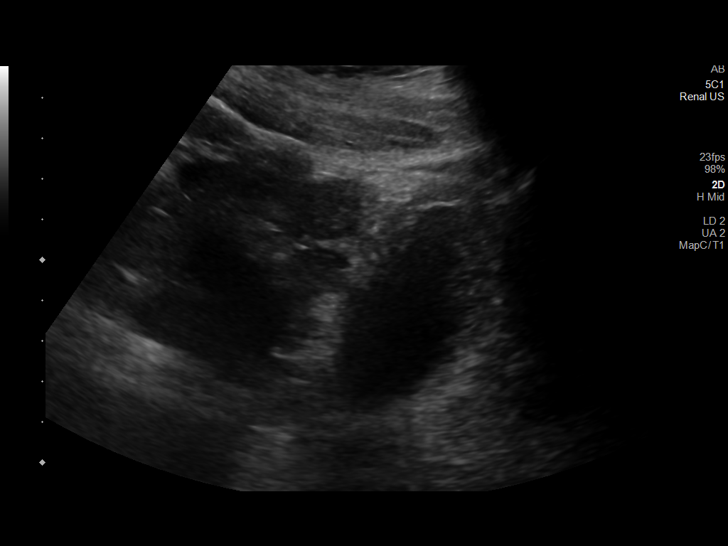
[im 31/46]
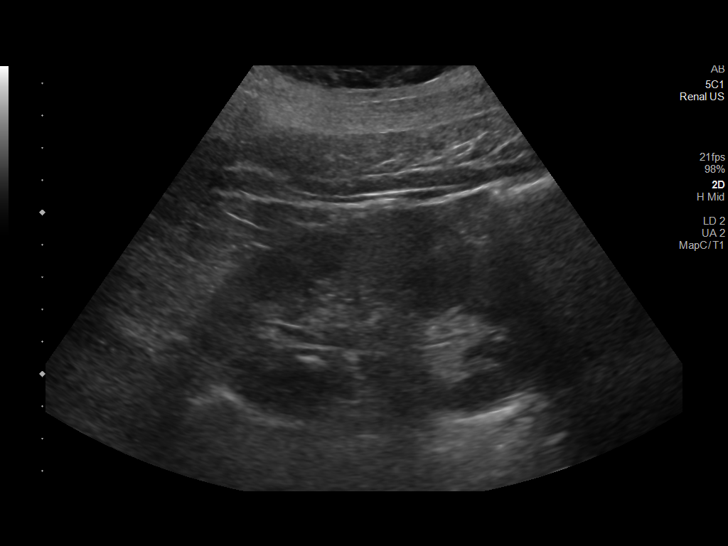
[im 33/46]
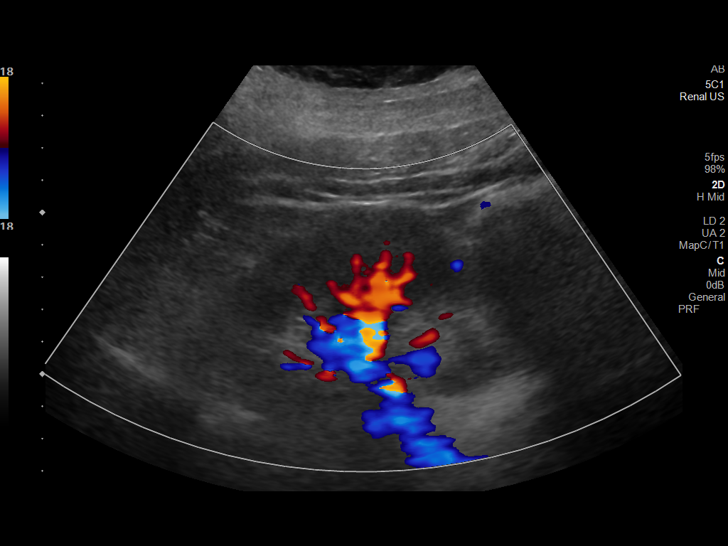
[im 37/46]
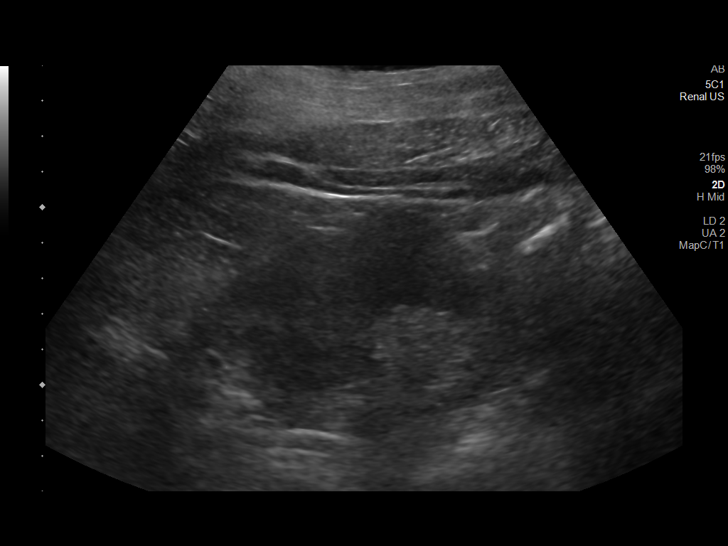
[im 41/46]
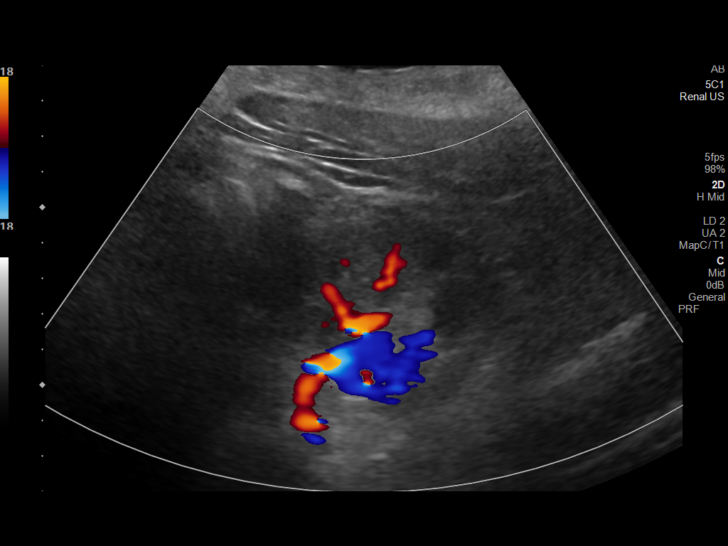
[im 46/46]
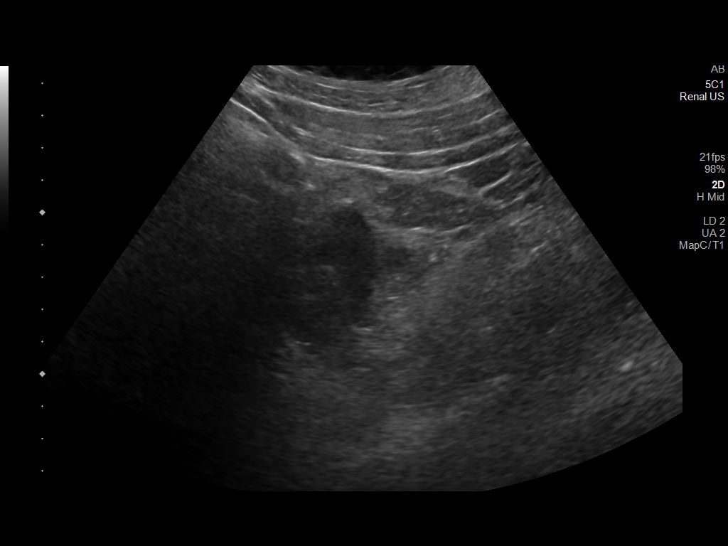

[Series 1002: us renal · 1 of 3 slices shown (2 of 2)]
[im 3/3]
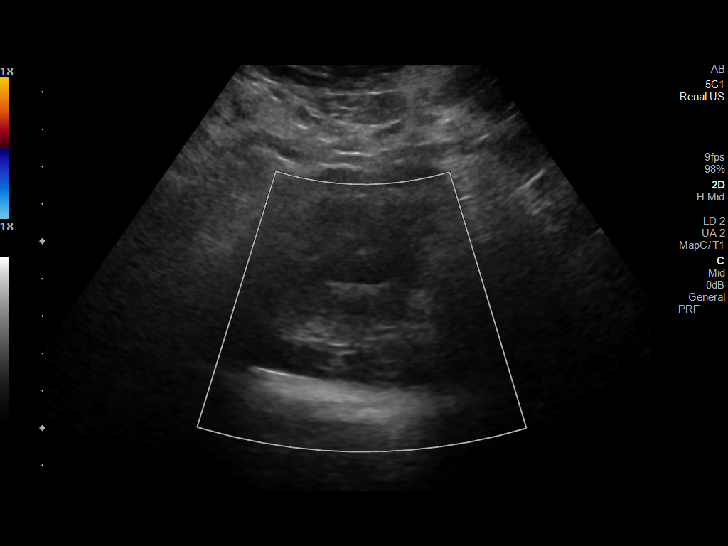

[14 of 25 positions shown; findings below may reference images not displayed]

FINDINGS: Right Kidney:

Renal measurements: 10.9 x 4.7 x 6.5 cm = volume: 173.4 mL.
Increased parenchymal echogenicity. No hydronephrosis. Small cyst
within the inferior pole measures 0.8 x 0.7 x 0.9 cm.

Left Kidney:

Renal measurements: 11.1 x 6.4 x 5.8 cm = volume: 214.2 mL.
Echogenicity within normal limits. No mass or hydronephrosis
visualized.

Bladder:

Is partially decompressed.  Left ureteral jet not visualized.

Other:

Prostate gland measures 3.1 x 2.8 x 4.3 cm (volume = 20 cm^3).
IMPRESSION: 1. No hydronephrosis.
2. Increased echogenicity of the right kidney suggesting chronic
medical renal disease.

## 2023-05-13 IMAGING — DX DG CHEST 2V
2 series · 2 of 2 positions shown · non-contrast
Comparison: Chest x-ray 12/13/2020.

CLINICAL DATA: 65-year-old male with history of chest pain.

EXAM:
CHEST - 2 VIEW

[chest pa]
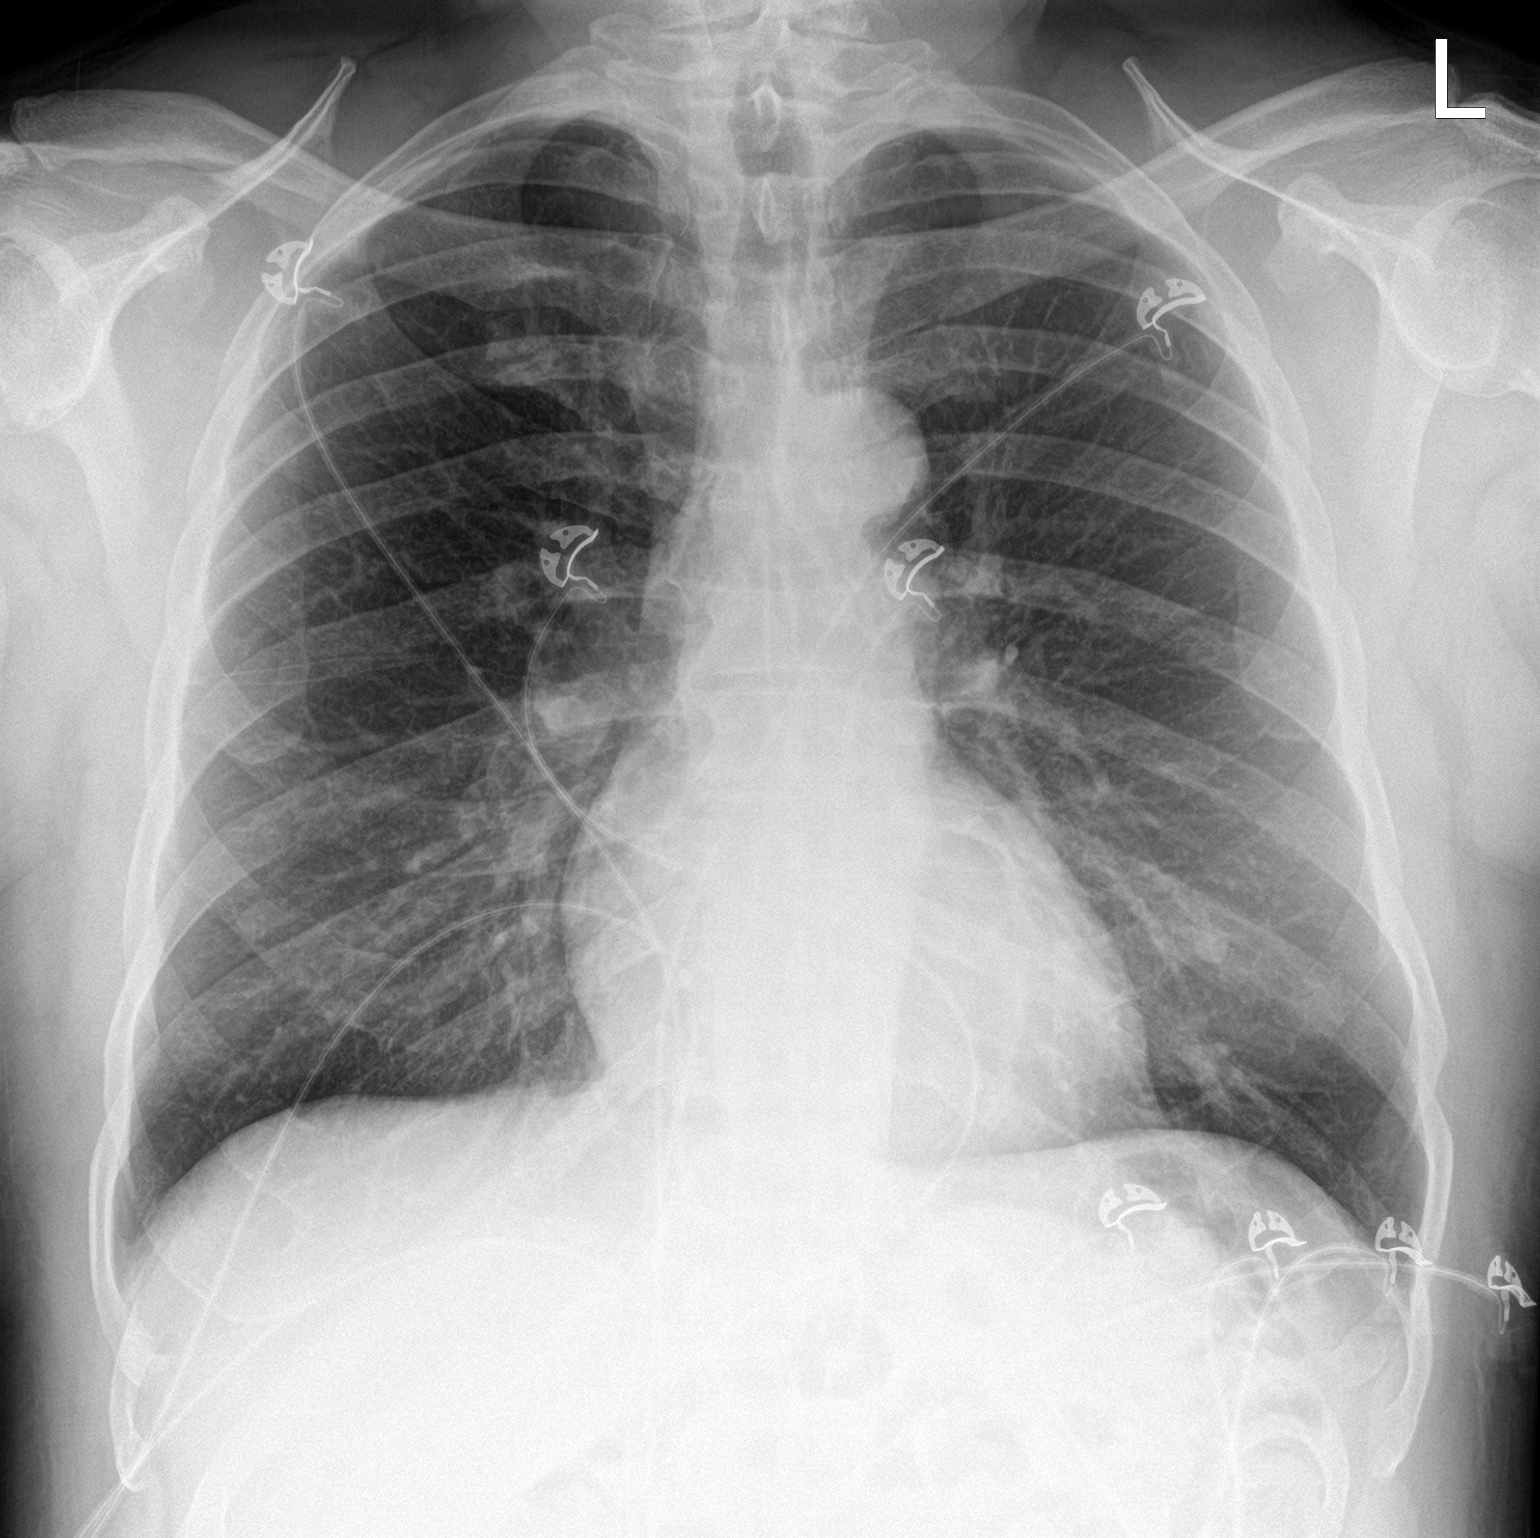

[chest lat]
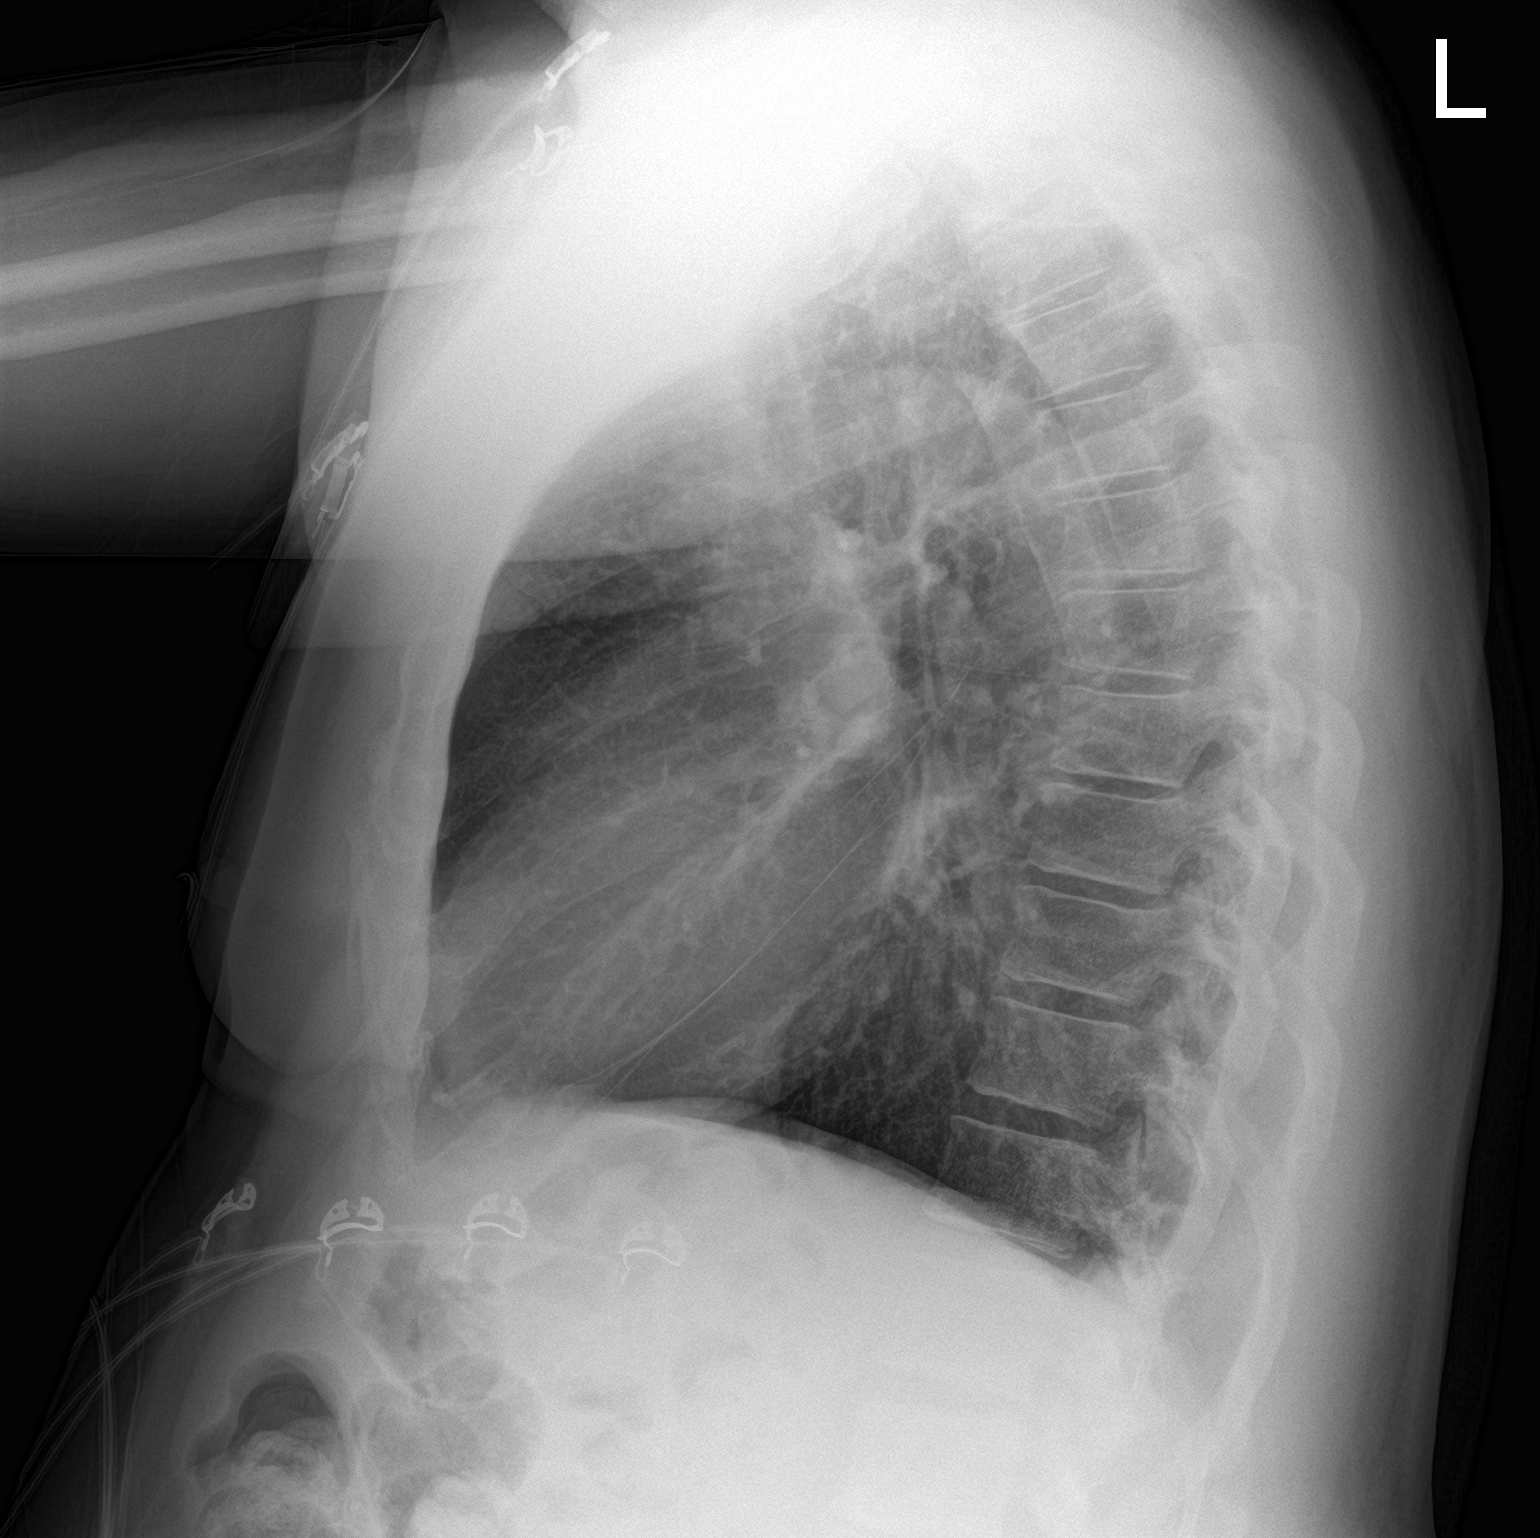

[2 of 2 positions shown; findings below may reference images not displayed]

FINDINGS: Lung volumes are normal. No consolidative airspace disease. No
pleural effusions. No pneumothorax. New 9 mm nodular density
projecting over the lower left lung seen only on the frontal
projection and not confidently identified on the lateral view.
Pulmonary vasculature and the cardiomediastinal silhouette are
within normal limits.
IMPRESSION: 1.  No radiographic evidence of acute cardiopulmonary disease.
2. New 9 mm nodular density projecting over the lower left lung.
This may simply represent a prominent nipple shadow, however, the
position appears to medial and the density appears greater than
expected. Repeat standing PA and lateral chest radiograph is
recommended in 2-3 weeks with nipple markers in place to re-evaluate
this finding and ensure stability or resolution.

## 2024-02-22 DEATH — deceased
